# Patient Record
Sex: Female | Born: 1975 | Race: White | Hispanic: No | Marital: Married | Smoking: Former smoker
Health system: Southern US, Community
[De-identification: ages and names within clinical notes are randomized; demographics above are authoritative.]

## PROBLEM LIST (undated history)

## (undated) DIAGNOSIS — O1420 HELLP syndrome (HELLP), unspecified trimester: Secondary | ICD-10-CM

## (undated) DIAGNOSIS — F419 Anxiety disorder, unspecified: Secondary | ICD-10-CM

## (undated) DIAGNOSIS — Z98891 History of uterine scar from previous surgery: Secondary | ICD-10-CM

## (undated) DIAGNOSIS — D689 Coagulation defect, unspecified: Secondary | ICD-10-CM

## (undated) DIAGNOSIS — Z789 Other specified health status: Secondary | ICD-10-CM

## (undated) DIAGNOSIS — T7840XA Allergy, unspecified, initial encounter: Secondary | ICD-10-CM

## (undated) DIAGNOSIS — E785 Hyperlipidemia, unspecified: Secondary | ICD-10-CM

## (undated) DIAGNOSIS — J302 Other seasonal allergic rhinitis: Secondary | ICD-10-CM

## (undated) DIAGNOSIS — K219 Gastro-esophageal reflux disease without esophagitis: Secondary | ICD-10-CM

## (undated) HISTORY — PX: NO PAST SURGERIES: SHX2092

## (undated) HISTORY — DX: Allergy, unspecified, initial encounter: T78.40XA

## (undated) HISTORY — DX: Gastro-esophageal reflux disease without esophagitis: K21.9

## (undated) HISTORY — DX: Anxiety disorder, unspecified: F41.9

## (undated) HISTORY — DX: Hyperlipidemia, unspecified: E78.5

## (undated) HISTORY — DX: Coagulation defect, unspecified: D68.9

---

## 1997-11-15 ENCOUNTER — Emergency Department (HOSPITAL_COMMUNITY): Admission: EM | Admit: 1997-11-15 | Discharge: 1997-11-15 | Payer: Self-pay | Admitting: Emergency Medicine

## 1997-11-15 ENCOUNTER — Encounter: Payer: Self-pay | Admitting: Emergency Medicine

## 1999-09-18 ENCOUNTER — Other Ambulatory Visit: Admission: RE | Admit: 1999-09-18 | Discharge: 1999-09-18 | Payer: Self-pay | Admitting: Obstetrics and Gynecology

## 2000-09-17 ENCOUNTER — Other Ambulatory Visit: Admission: RE | Admit: 2000-09-17 | Discharge: 2000-09-17 | Payer: Self-pay | Admitting: *Deleted

## 2001-07-06 ENCOUNTER — Emergency Department (HOSPITAL_COMMUNITY): Admission: EM | Admit: 2001-07-06 | Discharge: 2001-07-06 | Payer: Self-pay | Admitting: Emergency Medicine

## 2001-07-06 ENCOUNTER — Encounter: Payer: Self-pay | Admitting: Emergency Medicine

## 2001-12-27 ENCOUNTER — Other Ambulatory Visit: Admission: RE | Admit: 2001-12-27 | Discharge: 2001-12-27 | Payer: Self-pay | Admitting: Obstetrics and Gynecology

## 2011-03-13 ENCOUNTER — Other Ambulatory Visit: Payer: Self-pay

## 2011-04-02 LAB — OB RESULTS CONSOLE RPR: RPR: NONREACTIVE

## 2011-04-02 LAB — OB RESULTS CONSOLE RUBELLA ANTIBODY, IGM: Rubella: IMMUNE

## 2011-04-02 LAB — OB RESULTS CONSOLE HIV ANTIBODY (ROUTINE TESTING): HIV: NONREACTIVE

## 2011-04-02 LAB — OB RESULTS CONSOLE HEPATITIS B SURFACE ANTIGEN: Hepatitis B Surface Ag: NEGATIVE

## 2011-04-21 ENCOUNTER — Ambulatory Visit (HOSPITAL_COMMUNITY): Payer: Self-pay

## 2011-04-24 ENCOUNTER — Ambulatory Visit (HOSPITAL_COMMUNITY)
Admission: RE | Admit: 2011-04-24 | Discharge: 2011-04-24 | Disposition: A | Discharge status: Home / Self Care | Payer: BC Managed Care – PPO | Source: Ambulatory Visit | Attending: Obstetrics and Gynecology | Admitting: Obstetrics and Gynecology

## 2011-04-24 ENCOUNTER — Other Ambulatory Visit: Payer: Self-pay

## 2011-04-24 ENCOUNTER — Encounter (HOSPITAL_COMMUNITY): Payer: Self-pay

## 2011-04-24 DIAGNOSIS — O3510X0 Maternal care for (suspected) chromosomal abnormality in fetus, unspecified, not applicable or unspecified: Secondary | ICD-10-CM | POA: Insufficient documentation

## 2011-04-24 DIAGNOSIS — IMO0002 Reserved for concepts with insufficient information to code with codable children: Secondary | ICD-10-CM | POA: Insufficient documentation

## 2011-04-24 DIAGNOSIS — O351XX0 Maternal care for (suspected) chromosomal abnormality in fetus, not applicable or unspecified: Secondary | ICD-10-CM | POA: Insufficient documentation

## 2011-04-24 DIAGNOSIS — O280 Abnormal hematological finding on antenatal screening of mother: Secondary | ICD-10-CM | POA: Insufficient documentation

## 2011-04-24 NOTE — Progress Notes (Signed)
Genetic Counseling  High-Risk Gestation Note  Appointment Date:  04/24/2011 Referred By: Rodell Perna, NP Date of Birth:  02/02/1976 Partner:  Crystal Mitchell     Pregnancy History: G1P0000 Estimated Date of Delivery: 10/27/11 Estimated Gestational Age: [redacted]w[redacted]d Attending: Particia Nearing, MD   [name] and her partner, [name], were seen for genetic counseling because of an increased risk for fetal Down syndrome based on First trimester screening performed through [facility].  They were counseled regarding the First trimester result and the associated 1 in 90 risk for fetal Down syndrome.  We reviewed chromosomes, nondisjunction, and the common features and variable prognosis of Down syndrome.  In addition, we reviewed the screen adjusted reduction in risk for trisomy 18 (1 in 556 to 1 in 580).  We also discussed other explanations for a screen positive result including: differences in maternal metabolism and normal variation.  We reviewed other available screening options including detailed ultrasound and cell free fetal DNA testing. We discussed that noninvasive prenatal testing (NIPT) utilizes cell free fetal DNA found in the maternal circulation. This test is not diagnostic for chromosome conditions, but can provide information regarding the presence or absence of extra fetal DNA for chromosomes 13, 18 and 21. Thus, it would not identify or rule out all fetal aneuploidy. The reported detection rate is greater than 99% for Trisomy 21, greater than 97% for Trisomy 18, and is approximately 80% (8 out of 10) for Trisomy 13. The false positive rate is reported to be less than 1% for any of these conditions. They were counseled that 50-80% of fetuses with Down syndrome/up to 90% of fetuses with trisomy 18, when well visualized, have detectable anomalies or soft markers by ultrasound. Additionally, we discussed the diagnostic options of CVS and amniocentesis. We reviewed the risks,  benefits, and limitations of each. They understand that these screens and tests cannot rule out all birth defects or genetic syndromes.  After consideration of all the options, and a clear understanding of the newness and limitations of NIPT, they elected to proceed with cell free fetal DNA testing ([facility]) at the time of today's visit, but declined CVS and amniocentesis. Those results will be available in 8-10 days and will be forwarded to her OB office when we receive them. [name] will consider the option of amniocentesis pending the results of this screening test. The patient stated that a detailed ultrasound is scheduled in her primary OB office. An ultrasound was not performed at the time of today's visit.    Both family histories were reviewed and found to be contributory for the father of the pregnancy's maternal first cousin once removed with Down syndrome (unspecified type). We discussed that 95% of cases of Down syndrome are not inherited and are the result of non-disjunction.  Three to 4% of cases of Down syndrome are the result of a translocation involving chromosome #21.  We discussed the option of chromosome analysis to determine if an individual is a carrier of a balanced translocation involving chromosome #21.  If an individual carries a balanced translocation involving chromosome #21, then the chance to have a baby with Down syndrome would be greater than the maternal age-related risk.  Given the reported family history, this relative's Down syndrome most likely was sporadic. However, additional information is needed regarding her karyotype or the chromosomes of the father of the pregnancy to assess whether or not this history increases recurrence risk for relatives.   Additionally, the  father of the pregnancy reported a nephew (his brother's son) with Asperger syndrome. We discussed that Asperger syndrome is an autism spectrum disorder (ASD). We discussed that ASDs are among  the most common neurodevelopmental disorders, with approximately 1 in 88 children meeting criteria for ASD.  Approximately 80% of individuals diagnosed are female.  There is strong evidence that genetic factors play a critical role in development of ASD.  There have been recent advances in identifying specific genetic causes of ASD, however, there are still many individuals for whom the etiology of the ASD is not known.  Once a family has a child with a diagnosis of ASD, there is a 13.5% chance to have another child with ASD.  If the pregnancy is female the chance is approximately 9%, and approximately 26% if the pregnancy is female.  There are limited data regarding recurrence risk for ASD in extended degree relatives in the absence of an identified genetic cause. We discussed that given the degree of relation and assuming multifactorial inheritance, recurrence risk would be expected to be similar to the general population risk. They understand that at this time there is not genetic testing available for ASD for most families.    Crystal Mitchell reported that her brother had medical issues throughout his lifetime and passed away at age 37 years, all related to an umbilical cord issue at birth. Additional detailed information was not provided. We reviewed that in the case of a problem with the umbilical cord at labor and delivery, recurrence risk is typically not expected to be increased for relatives. However, in the case of a different underlying cause, recurrence risk estimate may change. Without further information regarding the provided family history, an accurate genetic risk cannot be calculated. Further genetic counseling is warranted if more information is obtained.  Crystal Mitchell was provided with written information regarding cystic fibrosis (CF) including the carrier frequency and incidence in the Caucasian population, the availability of carrier testing and prenatal diagnosis if indicated.  In addition, we  discussed that CF is routinely screened for as part of the [place] newborn screening panel.  She declined testing today.   Additionally, Ms. Goris reported Ashkenazi Jewish ancestry. The father of the pregnancy reported no known Ashkenazi Jewish ancestry. We discussed the availability of carrier screening for certain genetic conditions based on Jewish ancestry.  Genetic testing is available for some of the more common conditions, including Canavan disease, cystic fibrosis, Tay sachs disease, Gaucher disease, Mucolipidosis type IV,  Neimann-Pick type A, Fanconi anemia type C, Bloom syndrome, and familial dysautonomia.  All of these conditions are inherited in an autosomal recessive fashion, where both parents have to be carriers of the condition before a baby is at increased risk (1 in 4, or 25% risk) for the disease.  We reviewed that given that the father of the pregnancy has no known Ashkenazi Jewish ancestry, he would have a low chance to be a carrier for these conditions, aside from cystic fibrosis as previously discussed. Carrier screening performed through a standard blood draw can reduce but not eliminate a person's chance to be a carrier for these conditions. Ms. Clegg declined carrier screening at the time of today's visit.   Ms. Briunna Leicht denied exposure to environmental toxins or chemical agents. She denied the use of tobacco or street drugs. She reported drinking alcohol before knowing she was pregnant, the last occurrence on 02/15/11. The all-or-none period was discussed, meaning exposures that occur in the first 4 weeks of  gestation are typically thought to either not affect the pregnancy at all or result in a miscarriage. She denied significant viral illnesses during the course of her pregnancy. Her medical and surgical histories were noncontributory.   I counseled this couple for approximately 40 minutes regarding the above risks and available options.     Quinn Plowman, MS,    Certified Genetic Counselor 04/24/2011

## 2011-05-05 ENCOUNTER — Telehealth (HOSPITAL_COMMUNITY): Payer: Self-pay | Admitting: MS"

## 2011-05-05 NOTE — Telephone Encounter (Signed)
Called Crystal Mitchell to discuss her [facility], cell free fetal DNA testing.  We reviewed that these are within normal limits, showing a less than 1 in 10,000 risk for trisomies 21, 18 and 13.  We reviewed that this testing identifies > 99% of pregnancies with trisomy 21, >97% of pregnancies with trisomy 2, and >80% with trisomy 18; the false positive rate is <0.1% for all conditions.  She understands that this testing does not identify all genetic conditions. [name] stated that she feels comfortable with these results and is not interested in pursuing amniocentesis.  All questions were answered to her satisfaction, she was encouraged to call with additional questions or concerns.  Quinn Plowman, MS Patent attorney

## 2011-05-05 NOTE — Telephone Encounter (Signed)
Left message for patient to return call.

## 2011-08-27 ENCOUNTER — Inpatient Hospital Stay (HOSPITAL_COMMUNITY)
Admission: AD | Admit: 2011-08-27 | Discharge: 2011-09-01 | DRG: 651 | Disposition: A | Discharge status: Home / Self Care | Payer: BC Managed Care – PPO | Source: Ambulatory Visit | Attending: Obstetrics and Gynecology | Admitting: Obstetrics and Gynecology

## 2011-08-27 ENCOUNTER — Inpatient Hospital Stay (HOSPITAL_COMMUNITY): Payer: BC Managed Care – PPO

## 2011-08-27 ENCOUNTER — Encounter (HOSPITAL_COMMUNITY): Payer: Self-pay | Admitting: *Deleted

## 2011-08-27 DIAGNOSIS — O1420 HELLP syndrome (HELLP), unspecified trimester: Secondary | ICD-10-CM | POA: Diagnosis present

## 2011-08-27 DIAGNOSIS — O1414 Severe pre-eclampsia complicating childbirth: Principal | ICD-10-CM | POA: Diagnosis present

## 2011-08-27 DIAGNOSIS — Z98891 History of uterine scar from previous surgery: Secondary | ICD-10-CM

## 2011-08-27 DIAGNOSIS — O4590 Premature separation of placenta, unspecified, unspecified trimester: Secondary | ICD-10-CM | POA: Diagnosis present

## 2011-08-27 HISTORY — DX: HELLP syndrome (HELLP), unspecified trimester: O14.20

## 2011-08-27 HISTORY — DX: History of uterine scar from previous surgery: Z98.891

## 2011-08-27 HISTORY — DX: Other specified health status: Z78.9

## 2011-08-27 HISTORY — DX: Other seasonal allergic rhinitis: J30.2

## 2011-08-27 LAB — URINE MICROSCOPIC-ADD ON

## 2011-08-27 LAB — COMPREHENSIVE METABOLIC PANEL
ALT: 114 U/L — ABNORMAL HIGH (ref 0–35)
Albumin: 2.4 g/dL — ABNORMAL LOW (ref 3.5–5.2)
BUN: 14 mg/dL (ref 6–23)
BUN: 13 mg/dL (ref 6–23)
CO2: 23 mEq/L (ref 19–32)
Calcium: 9.1 mg/dL (ref 8.4–10.5)
Calcium: 8.6 mg/dL (ref 8.4–10.5)
Creatinine, Ser: 0.73 mg/dL (ref 0.50–1.10)
GFR calc Af Amer: >90 mL/min (ref >90)
GFR calc Af Amer: >90 mL/min (ref >90)
GFR calc non Af Amer: >90 mL/min (ref >90)
Glucose, Bld: 72 mg/dL (ref 70–99)
Glucose, Bld: 95 mg/dL (ref 70–99)
Potassium: 4.2 mEq/L (ref 3.5–5.1)
Total Protein: 5.6 g/dL — ABNORMAL LOW (ref 6.0–8.3)
Total Protein: 5.6 g/dL — ABNORMAL LOW (ref 6.0–8.3)

## 2011-08-27 LAB — CBC
HCT: 35.1 % — ABNORMAL LOW (ref 36.0–46.0)
HCT: 36 % (ref 36.0–46.0)
Hemoglobin: 12.5 g/dL (ref 12.0–15.0)
Hemoglobin: 12.9 g/dL (ref 12.0–15.0)
MCH: 31.9 pg (ref 26.0–34.0)
MCH: 31.9 pg (ref 26.0–34.0)
MCHC: 35.6 g/dL (ref 30.0–36.0)
MCHC: 35.8 g/dL (ref 30.0–36.0)
MCV: 89.5 fL (ref 78.0–100.0)
RBC: 3.92 MIL/uL (ref 3.87–5.11)
RDW: 13.1 % (ref 11.5–15.5)

## 2011-08-27 LAB — URINALYSIS, ROUTINE W REFLEX MICROSCOPIC
Bilirubin Urine: NEGATIVE
Ketones, ur: NEGATIVE mg/dL
Leukocytes, UA: NEGATIVE
Nitrite: NEGATIVE
Specific Gravity, Urine: 1.01 (ref 1.005–1.030)
Urobilinogen, UA: 0.2 mg/dL (ref 0.0–1.0)
pH: 6.5 (ref 5.0–8.0)

## 2011-08-27 MED ORDER — PRENATAL MULTIVITAMIN CH
1.0000 | ORAL_TABLET | Freq: Every day | ORAL | Status: DC
  Administered 2011-08-29: 1 via ORAL
  Filled 2011-08-27 (×3): qty 1

## 2011-08-27 MED ORDER — MAGNESIUM SULFATE 40 MG/ML IJ SOLN
4.0000 g | Freq: Once | INTRAMUSCULAR | Status: DC
  Filled 2011-08-27: qty 100

## 2011-08-27 MED ORDER — ACETAMINOPHEN 325 MG PO TABS
650.0000 mg | ORAL_TABLET | ORAL | Status: DC | PRN
  Administered 2011-08-28: 650 mg via ORAL
  Filled 2011-08-27: qty 2

## 2011-08-27 MED ORDER — MAGNESIUM SULFATE BOLUS VIA INFUSION
4.0000 g | Freq: Once | INTRAVENOUS | Status: AC
  Administered 2011-08-27: 4 g via INTRAVENOUS
  Filled 2011-08-27: qty 500

## 2011-08-27 MED ORDER — DEXTROSE-NACL 5-0.45 % IV SOLN
INTRAVENOUS | Status: DC
  Administered 2011-08-27 – 2011-08-29 (×4): via INTRAVENOUS

## 2011-08-27 MED ORDER — MAGNESIUM SULFATE 40 G IN LACTATED RINGERS - SIMPLE
2.0000 g/h | Freq: Once | INTRAVENOUS | Status: AC
  Administered 2011-08-27: 2 g/h via INTRAVENOUS
  Filled 2011-08-27: qty 500

## 2011-08-27 MED ORDER — FAMOTIDINE 20 MG PO TABS
20.0000 mg | ORAL_TABLET | Freq: Once | ORAL | Status: AC
  Administered 2011-08-27: 20 mg via ORAL
  Filled 2011-08-27: qty 1

## 2011-08-27 MED ORDER — LACTATED RINGERS IV SOLN
INTRAVENOUS | Status: DC
  Administered 2011-08-27: 16:00:00 via INTRAVENOUS

## 2011-08-27 MED ORDER — DOCUSATE SODIUM 100 MG PO CAPS
100.0000 mg | ORAL_CAPSULE | Freq: Every day | ORAL | Status: DC
  Administered 2011-08-29: 100 mg via ORAL
  Filled 2011-08-27 (×3): qty 1

## 2011-08-27 MED ORDER — BETAMETHASONE SOD PHOS & ACET 6 (3-3) MG/ML IJ SUSP
12.0000 mg | INTRAMUSCULAR | Status: DC
  Administered 2011-08-27: 12 mg via INTRAMUSCULAR
  Filled 2011-08-27: qty 2

## 2011-08-27 MED ORDER — ZOLPIDEM TARTRATE 5 MG PO TABS: 5.0000 mg | ORAL_TABLET | Freq: Every evening | ORAL | Status: DC | PRN

## 2011-08-27 MED ORDER — CALCIUM CARBONATE ANTACID 500 MG PO CHEW
2.0000 | CHEWABLE_TABLET | ORAL | Status: DC | PRN
  Filled 2011-08-27: qty 2

## 2011-08-27 MED ORDER — MAGNESIUM SULFATE 40 G IN LACTATED RINGERS - SIMPLE
2.0000 g/h | Freq: Once | INTRAVENOUS | Status: DC
  Filled 2011-08-27: qty 500

## 2011-08-27 NOTE — MAU Note (Signed)
Pt states she had vomitting & epigastric, back & R shoulder pain on Monday, states she felt better on Tuesday.  Pt was sick again with abd pain on Wednesday, was seen in MD office today, sent for [facility] eval.  Pt has facial edema, also edema in legs & feet.

## 2011-08-27 NOTE — MAU Provider Note (Signed)
Chief Complaint:  Facial Swelling, Hypertension and Edema  First Provider Initiated Contact with Patient 08/27/11 1406     HPI  Crystal Mitchell is a 36 y.o. G1P0000 at [redacted]w[redacted]d she was sent to MAU from the office for Pre-eclampsia work-up. She reports facial swelling, heartburn vs epigastric pain, elevated BP in the office low urine output, concentrated urine. UA showed significant proteinuria w/ blood in the specimen per pt. Denies HA, scotoma, urinary Sx, contractions, leakage of fluid or vaginal bleeding. Good fetal movement.   Past Medical History: Past Medical History  Diagnosis Date  . Seasonal allergies     Past Surgical History: Past Surgical History  Procedure Date  . No past surgeries     Family History: Family History  Problem Relation Age of Onset  . Other Neg Hx     Social History: History  Substance Use Topics  . Smoking status: Never Smoker   . Smokeless tobacco: Not on file  . Alcohol Use: No    Allergies:  Allergies  Allergen Reactions  . Amoxicillin Nausea And Vomiting    Meds:  Prescriptions prior to admission  Medication Sig Dispense Refill  . acetaminophen (TYLENOL) 325 MG tablet Take 325 mg by mouth every 6 (six) hours as needed.      . calcium carbonate (TUMS - DOSED IN MG ELEMENTAL CALCIUM) 500 MG chewable tablet Chew 1 tablet by mouth daily.      . Docosahexaenoic Acid (DHA OMEGA 3 PO) Take by mouth.      . Prenatal Vit-Fe Fumarate-FA (PRENATAL MULTIVITAMIN) TABS Take 1 tablet by mouth daily.          Physical Exam  Blood pressure 162/107, pulse 77, temperature 98.1 F (36.7 C), temperature source Oral, resp. rate 20, last menstrual period 01/20/2011. GENERAL: Well-developed, well-nourished female in no acute distress.  HEENT: normocephalic, good dentition HEART: normal rate RESP: normal effort ABDOMEN: Soft, nontender, gravid appropriate for gestational age EXTREMITIES: Nontender, no edema NEURO: alert and oriented  SPECULUM  EXAM: Deferred  FHT:  Baseline 140 , moderate variability, accelerations present, no decelerations Contractions: irreg, painless   Labs: Results for orders placed during the hospital encounter of 08/27/11 (from the past 24 hour(s))  CBC     Status: Abnormal   Collection Time   08/27/11  2:00 PM      Component Value Range   WBC 10.5  4.0 - 10.5 K/uL   RBC 3.92  3.87 - 5.11 MIL/uL   Hemoglobin 12.5  12.0 - 15.0 g/dL   HCT 14.7 (*) 82.9 - 56.2 %   MCV 89.5  78.0 - 100.0 fL   MCH 31.9  26.0 - 34.0 pg   MCHC 35.6  30.0 - 36.0 g/dL   RDW 13.0  86.5 - 78.4 %   Platelets 104 (*) 150 - 400 K/uL  COMPREHENSIVE METABOLIC PANEL     Status: Abnormal   Collection Time   08/27/11  2:00 PM      Component Value Range   Sodium 132 (*) 135 - 145 mEq/L   Potassium 4.2  3.5 - 5.1 mEq/L   Chloride 99  96 - 112 mEq/L   CO2 23  19 - 32 mEq/L   Glucose, Bld 72  70 - 99 mg/dL   BUN 14  6 - 23 mg/dL   Creatinine, Ser 6.96  0.50 - 1.10 mg/dL   Calcium 9.1  8.4 - 29.5 mg/dL   Total Protein 5.6 (*) 6.0 - 8.3 g/dL  Albumin 2.5 (*) 3.5 - 5.2 g/dL   AST 440 (*) 0 - 37 U/L   ALT 114 (*) 0 - 35 U/L   Alkaline Phosphatase 154 (*) 39 - 117 U/L   Total Bilirubin 1.0  0.3 - 1.2 mg/dL   GFR calc non Af Amer >90  >90 mL/min   GFR calc Af Amer >90  >90 mL/min  URINALYSIS, ROUTINE W REFLEX MICROSCOPIC     Status: Abnormal   Collection Time   08/27/11  3:00 PM      Component Value Range   Color, Urine YELLOW  YELLOW   APPearance CLEAR  CLEAR   Specific Gravity, Urine 1.010  1.005 - 1.030   pH 6.5  5.0 - 8.0   Glucose, UA NEGATIVE  NEGATIVE mg/dL   Hgb urine dipstick LARGE (*) NEGATIVE   Bilirubin Urine NEGATIVE  NEGATIVE   Ketones, ur NEGATIVE  NEGATIVE mg/dL   Protein, ur >347 (*) NEGATIVE mg/dL   Urobilinogen, UA 0.2  0.0 - 1.0 mg/dL   Nitrite NEGATIVE  NEGATIVE   Leukocytes, UA NEGATIVE  NEGATIVE  URINE MICROSCOPIC-ADD ON     Status: Abnormal   Collection Time   08/27/11  3:00 PM      Component Value  Range   Squamous Epithelial / LPF FEW (*) RARE   WBC, UA 0-2  <3 WBC/hpf   RBC / HPF 0-2  <3 RBC/hpf   Imaging:  NA  Assessment: HELLP syndrome  Plan: Admit to Antenatal per consult w/ Dr. Senaida Ores for BMZ, Magnesium sulfate, NICU consult, repeat labs.  [place], [place] 7/18/20133:25 PM

## 2011-08-27 NOTE — MAU Note (Signed)
Increased swelling in face and ankles since Fri.  BP up in office today.  Reports increase heartburn and pain in upper back.  Denies headache or epigastric pain.

## 2011-08-27 NOTE — Anesthesia Preprocedure Evaluation (Addendum)
Anesthesia Evaluation  Patient identified by MRN, date of birth, ID band Patient awake    Reviewed: Allergy & Precautions, H&P , NPO status , Patient's Chart, lab work & pertinent test results, reviewed documented beta blocker date and time   History of Anesthesia Complications Negative for: history of anesthetic complications  Airway Mallampati: III TM Distance: <3 FB Neck ROM: full    Dental  (+) Teeth Intact   Pulmonary neg pulmonary ROS,  breath sounds clear to auscultation        Cardiovascular hypertension (severe preeclampsia/HELLP syndrome), Rhythm:regular Rate:Normal     Neuro/Psych negative neurological ROS  negative psych ROS   GI/Hepatic GERD-  ,Elevated liver enzymes and right upper quadrant pain   Endo/Other  negative endocrine ROS  Renal/GU negative Renal ROS  negative genitourinary   Musculoskeletal   Abdominal   Peds  Hematology Thrombocytopenia - plts 104   Anesthesia Other Findings Breath sounds clear - but complains of right upper quadrant pain with deep inspiration for lung auscultation Significant edema (has gained 6 pounds this week)  Repeat Plts @ 0751 7/19...Marland KitchenMarland KitchenMarland Kitchen109K  Reproductive/Obstetrics (+) Pregnancy                         Anesthesia Physical Anesthesia Plan  ASA: III  Anesthesia Plan: Spinal and General ETT   Post-op Pain Management:    Induction:   Airway Management Planned:   Additional Equipment:   Intra-op Plan:   Post-operative Plan:   Informed Consent: I have reviewed the patients History and Physical, chart, labs and discussed the procedure including the risks, benefits and alternatives for the proposed anesthesia with the patient or authorized representative who has indicated his/her understanding and acceptance.     Plan Discussed with: CRNA and Surgeon  Anesthesia Plan Comments: (Talked at length with patient about potential anesthetic  plans and risks.  Discussed GA vs SAB for c/s depending on platelet count.  Per Dr Senaida Ores, pt was just admitted and started on Magnesium.  Will recheck labs at 8 pm, may need delivery if LFTs or platelets worsening.  Jasmine December, MD)        Anesthesia Quick Evaluation

## 2011-08-27 NOTE — Consult Note (Signed)
[facility]  Neonatal Medicine Consultation       08/27/2011    10:08 PM  I was called at the request of the patient's obstetrician (Dr. Senaida Ores) to speak to this patient due to likely preterm delivery due to severe preeclampsia.  The patient is currently at 31 2/7 weeks.  She is receiving treatment with magnesium sulfate, betamethasone.    I reviewed expectations for a premature delivery at 31 weeks and beyond.  I covered typical issues such as respiratory distress, infection, feeding, retinopathy, bleeding, length of stay.  Mom plans to breast feed, which I encouraged.    _____________________ Electronically Signed By: Angelita Ingles, MD Neonatologist

## 2011-08-27 NOTE — H&P (Signed)
Crystal Mitchell is a 36 y.o. female G1P0 at 56 2/7 weeks (EDD 10/27/11 by LMP c/w 9 week [place]) presenting for preeclampsia/HELLP sx.  Pt seen in office with c/o epigastric pain and found to have 3+proteinuria and elevated blood pressures to 140-160/90-100.  Sent to MAU for labwork and NST . Labs showed abnormality of LFT's at 137/114 and platelets at 104,000.  Pt given pepcid with some improvement in epigastric pain, no HA currently--but has had some this week.  Prenatal care significant only for an elevated DSR of 1:90.  Pt had MFM consult with normal [place] and [facility] test.  Baseline BP this pregnancy are 120/60-70.  History OB History    Grav Para Term Preterm Abortions TAB SAB Ect Mult Living   1 0 0 0 0 0 0 0 0 0      Past Medical History  Diagnosis Date  . Seasonal allergies   . No pertinent past medical history    Past Surgical History  Procedure Date  . No past surgeries    Family History: family history includes Heart disease in her maternal grandfather, paternal grandfather, and paternal grandmother and Hypertension in her maternal grandmother and mother.  There is no history of Other. Social History:  reports that she has never smoked. She does not have any smokeless tobacco history on file. She reports that she does not drink alcohol or use illicit drugs.   Prenatal Transfer Tool  Maternal Diabetes: No  One hour GTT elevated at 154                                          Three hour GTT WNL Genetic Screening: Abnormal:  Results: Elevated risk of Trisomy 21 on first trimester screen.  [facility] test negative and MFM US WNL. Maternal Ultrasounds/Referrals: Normal Fetal Ultrasounds or other Referrals:  Referred to Materal Fetal Medicine  Maternal Substance Abuse:  No Significant Maternal Medications:  Meds include: Other: see prenatal record  Betamethasone given 08/27/11 at 1637pm Significant Maternal Lab Results: Maternal HELLP syndrome Other Comments:  Mother on magnesium for  preeclampsia intrapartum  Review of Systems  Gastrointestinal: Positive for abdominal pain.  Neurological: Positive for headaches.      Blood pressure 172/102, pulse 76, temperature 98.1 F (36.7 C), temperature source Oral, resp. rate 22, height 5\' 4"  (1.626 m), weight 77.111 kg (170 lb), last menstrual period 01/20/2011. Maternal Exam:  Uterine Assessment: Contraction frequency is rare.   Abdomen: Patient reports no abdominal tenderness. Fetal presentation: vertex  Introitus: Normal vulva. Normal vagina.    Physical Exam  Constitutional: She is oriented to person, place, and time. She appears well-developed and well-nourished.  Cardiovascular: Normal rate and regular rhythm.   Respiratory: Effort normal and breath sounds normal.  GI: Soft. Bowel sounds are normal.  Genitourinary: Vagina normal and uterus normal.  Neurological: She is alert and oriented to person, place, and time.  Psychiatric: She has a normal mood and affect. Her behavior is normal.    Prenatal labs: ABO, Rh: A/Positive/-- (02/21 0000) Antibody:  negative Rubella: Immune (02/21 0000) RPR: Nonreactive (02/21 0000)  HBsAg: Negative (02/21 0000)  HIV: Non-reactive (02/21 0000)  GBS:   unknown One hour GTT 154 Three hour GTT 69, 166, 129, 91  Assessment/Plan: Pt admitted for close surveillance of preeclampsia/HELLP syndrome.  Given first dose of betamethasone at 1637pm.  Continuous monitoring and NPO for  now.  Pt started on magnesium for seizure prophylaxis.  Will repeat labs at 2000pm and if stable allow pt to eat.   Formal US report pending, but baby very active vertex and normal growth.  EFM reactive.  D/w pt possibility of c-section to deliver if platelets drop precipitously or labs change rapidly.  Dr. Rodman Pickle and NICU aware of pt and consulted to see her.  Oliver Pila 08/27/2011, 5:58 PM

## 2011-08-27 NOTE — Progress Notes (Signed)
Patient ID: Crystal Mitchell, female   DOB: 1975/11/21, 36 y.o.   MRN: [id number] Pt with repeat labs showing that LFT's are the same and platelets slightly lower at 89,000.  BP about the same 160/90-100 for the most part.   Discussed pt with Rica Koyanagi of MFM and she recommends repeat labs in a few hours and if platelets drop further delivery.  If platelets are about the same can try to press on to get steroids on board. Pt and husband updated.

## 2011-08-28 ENCOUNTER — Encounter (HOSPITAL_COMMUNITY): Payer: Self-pay | Admitting: *Deleted

## 2011-08-28 LAB — CBC
HCT: 35.8 % — ABNORMAL LOW (ref 36.0–46.0)
HCT: 32.9 % — ABNORMAL LOW (ref 36.0–46.0)
Hemoglobin: 12.7 g/dL (ref 12.0–15.0)
Hemoglobin: 12.5 g/dL (ref 12.0–15.0)
Hemoglobin: 11.7 g/dL — ABNORMAL LOW (ref 12.0–15.0)
MCH: 31.9 pg (ref 26.0–34.0)
MCH: 32.4 pg (ref 26.0–34.0)
MCHC: 35.8 g/dL (ref 30.0–36.0)
MCHC: 35.6 g/dL (ref 30.0–36.0)
MCV: 89.2 fL (ref 78.0–100.0)
MCV: 90.2 fL (ref 78.0–100.0)
Platelets: 94 10*3/uL — ABNORMAL LOW (ref 150–400)
RBC: 3.98 MIL/uL (ref 3.87–5.11)
RBC: 3.99 MIL/uL (ref 3.87–5.11)
RBC: 3.86 MIL/uL — ABNORMAL LOW (ref 3.87–5.11)

## 2011-08-28 LAB — COMPREHENSIVE METABOLIC PANEL
ALT: 95 U/L — ABNORMAL HIGH (ref 0–35)
AST: 107 U/L — ABNORMAL HIGH (ref 0–37)
AST: 76 U/L — ABNORMAL HIGH (ref 0–37)
Albumin: 2.5 g/dL — ABNORMAL LOW (ref 3.5–5.2)
Alkaline Phosphatase: 149 U/L — ABNORMAL HIGH (ref 39–117)
BUN: 15 mg/dL (ref 6–23)
CO2: 21 mEq/L (ref 19–32)
CO2: 20 mEq/L (ref 19–32)
CO2: 20 mEq/L (ref 19–32)
Calcium: 8.2 mg/dL — ABNORMAL LOW (ref 8.4–10.5)
Calcium: 7.8 mg/dL — ABNORMAL LOW (ref 8.4–10.5)
Calcium: 7.3 mg/dL — ABNORMAL LOW (ref 8.4–10.5)
Creatinine, Ser: 0.74 mg/dL (ref 0.50–1.10)
Creatinine, Ser: 0.79 mg/dL (ref 0.50–1.10)
Creatinine, Ser: 0.76 mg/dL (ref 0.50–1.10)
GFR calc Af Amer: >90 mL/min (ref >90)
GFR calc Af Amer: >90 mL/min (ref >90)
GFR calc Af Amer: >90 mL/min (ref >90)
GFR calc non Af Amer: >90 mL/min (ref >90)
GFR calc non Af Amer: >90 mL/min (ref >90)
GFR calc non Af Amer: >90 mL/min (ref >90)
GFR calc non Af Amer: >90 mL/min (ref >90)
Glucose, Bld: 130 mg/dL — ABNORMAL HIGH (ref 70–99)
Glucose, Bld: 163 mg/dL — ABNORMAL HIGH (ref 70–99)
Potassium: 4.5 mEq/L (ref 3.5–5.1)
Sodium: 132 mEq/L — ABNORMAL LOW (ref 135–145)
Total Bilirubin: 0.6 mg/dL (ref 0.3–1.2)
Total Protein: 5.5 g/dL — ABNORMAL LOW (ref 6.0–8.3)
Total Protein: 5.4 g/dL — ABNORMAL LOW (ref 6.0–8.3)

## 2011-08-28 LAB — MAGNESIUM: Magnesium: 6.1 mg/dL (ref 1.5–2.5)

## 2011-08-28 MED ORDER — MAGNESIUM SULFATE 40 G IN LACTATED RINGERS - SIMPLE: 1.0000 g/h | INTRAVENOUS | Status: DC

## 2011-08-28 MED ORDER — BETAMETHASONE SOD PHOS & ACET 6 (3-3) MG/ML IJ SUSP
12.0000 mg | Freq: Once | INTRAMUSCULAR | Status: AC
  Administered 2011-08-28: 12 mg via INTRAMUSCULAR
  Filled 2011-08-28: qty 2

## 2011-08-28 MED ORDER — FAMOTIDINE 20 MG PO TABS
20.0000 mg | ORAL_TABLET | Freq: Two times a day (BID) | ORAL | Status: DC
  Administered 2011-08-28 – 2011-09-01 (×6): 20 mg via ORAL
  Filled 2011-08-28 (×8): qty 1

## 2011-08-28 MED ORDER — SODIUM CHLORIDE 0.9 % IJ SOLN: 10.0000 mL | Freq: Two times a day (BID) | INTRAMUSCULAR | Status: DC

## 2011-08-28 MED ORDER — SODIUM CHLORIDE 0.9 % IJ SOLN
3.0000 mL | Freq: Two times a day (BID) | INTRAMUSCULAR | Status: DC
  Administered 2011-08-28 – 2011-08-31 (×5): 3 mL via INTRAVENOUS

## 2011-08-28 NOTE — Progress Notes (Signed)
Patient ID: Crystal Mitchell, female   DOB: Jun 23, 1975, 36 y.o.   MRN: [id number]  Pt states no HA, +FM, no LOF, no VB, no ctx. D/w anesthesia, likely to place dry cath AF 150/90, Plt at 8pm 109 gen NAD Abd soft, FNT  FHTs reassuring toco none  D/w pt POC Meals of clears Likely LTCS when plts fall or LFTs bump Monitor labs q 6 hr

## 2011-08-28 NOTE — Progress Notes (Signed)
Patient ID: Crystal Mitchell, female   DOB: [birth year], 36 y.o.   MRN: [id number] Pt resting comfortably. Repeat labs stable with platelets up to 94,000 and LFT's slightly improved as well.  BP stable on Magnesium 150-160/90-100 Will repeat betamethasone at 430am and plan repeat labs for 0800.  Pt still NPO as will possibly require general anesthesia at delivery.

## 2011-08-28 NOTE — Progress Notes (Signed)
Spoke to md - pt may eat - will repeat labs now every 6 hrs, next at 8pm

## 2011-08-28 NOTE — Progress Notes (Signed)
Patient ID: Crystal Mitchell, female   DOB: 1975-10-06, 36 y.o.   MRN: [id number] 31+ wk with HELLP Mildly elevated BP, plts stable, LFTs stable Plan for delivery tomorrow 48hr after steroids, 4:30pm Magnesium decreased secondary to symptoms, level was 6.1

## 2011-08-28 NOTE — Anesthesia Procedure Notes (Signed)
Epidural Patient location during procedure: OB Start time: 08/28/2011 12:55 PM End time: 08/28/2011 1:12 PM  Staffing Anesthesiologist: Jiles Garter  Preanesthetic Checklist Completed: patient identified, site marked, surgical consent, pre-op evaluation, timeout performed, IV checked, risks and benefits discussed and monitors and equipment checked  Epidural Patient position: sitting Prep: site prepped and draped and DuraPrep Patient monitoring: continuous pulse ox and blood pressure Approach: midline Injection technique: LOR air  Needle:  Needle type: Tuohy  Needle gauge: 17 G Needle length: 9 cm Needle insertion depth: 6 cm Catheter type: closed end flexible Catheter size: 19 Gauge Catheter at skin depth: 11 cm Test dose: negative  Assessment Events: blood not aspirated, injection not painful, no injection resistance, negative IV test and no paresthesia  Additional Notes Vague L sided mid back parasthesia with catheter insertion. Transient and not distinct location

## 2011-08-28 NOTE — Progress Notes (Signed)
Dry epidural cath placed by Dr. Jean Rosenthal. Pt tolerated procedure well.

## 2011-08-28 NOTE — Progress Notes (Signed)
UR Chart review completed.  

## 2011-08-29 ENCOUNTER — Encounter (HOSPITAL_COMMUNITY): Payer: Self-pay | Admitting: Anesthesiology

## 2011-08-29 ENCOUNTER — Encounter (HOSPITAL_COMMUNITY): Payer: Self-pay | Admitting: *Deleted

## 2011-08-29 ENCOUNTER — Encounter (HOSPITAL_COMMUNITY): Payer: Self-pay

## 2011-08-29 ENCOUNTER — Encounter (HOSPITAL_COMMUNITY)
Admission: AD | Disposition: A | Discharge status: Home / Self Care | Payer: Self-pay | Source: Ambulatory Visit | Attending: Obstetrics and Gynecology

## 2011-08-29 ENCOUNTER — Inpatient Hospital Stay (HOSPITAL_COMMUNITY): Payer: BC Managed Care – PPO | Admitting: Anesthesiology

## 2011-08-29 LAB — CBC
HCT: 34.4 % — ABNORMAL LOW (ref 36.0–46.0)
HCT: 30.3 % — ABNORMAL LOW (ref 36.0–46.0)
Hemoglobin: 12.4 g/dL (ref 12.0–15.0)
Hemoglobin: 12 g/dL (ref 12.0–15.0)
MCH: 31.8 pg (ref 26.0–34.0)
MCH: 32 pg (ref 26.0–34.0)
MCHC: 35.6 g/dL (ref 30.0–36.0)
MCHC: 34.9 g/dL (ref 30.0–36.0)
MCHC: 35.3 g/dL (ref 30.0–36.0)
MCV: 90.3 fL (ref 78.0–100.0)
MCV: 91 fL (ref 78.0–100.0)
MCV: 90.7 fL (ref 78.0–100.0)
Platelets: 149 10*3/uL — ABNORMAL LOW (ref 150–400)
Platelets: 148 10*3/uL — ABNORMAL LOW (ref 150–400)
RBC: 3.81 MIL/uL — ABNORMAL LOW (ref 3.87–5.11)
RDW: 13.3 % (ref 11.5–15.5)
RDW: 13.4 % (ref 11.5–15.5)
RDW: 13.4 % (ref 11.5–15.5)

## 2011-08-29 LAB — TYPE AND SCREEN
ABO/RH(D): A POS
Antibody Screen: NEGATIVE

## 2011-08-29 LAB — COMPREHENSIVE METABOLIC PANEL
ALT: 94 U/L — ABNORMAL HIGH (ref 0–35)
AST: 63 U/L — ABNORMAL HIGH (ref 0–37)
AST: 91 U/L — ABNORMAL HIGH (ref 0–37)
Albumin: 2.5 g/dL — ABNORMAL LOW (ref 3.5–5.2)
Albumin: 2.6 g/dL — ABNORMAL LOW (ref 3.5–5.2)
Albumin: 2.6 g/dL — ABNORMAL LOW (ref 3.5–5.2)
Alkaline Phosphatase: 142 U/L — ABNORMAL HIGH (ref 39–117)
Alkaline Phosphatase: 149 U/L — ABNORMAL HIGH (ref 39–117)
BUN: 16 mg/dL (ref 6–23)
Chloride: 97 mEq/L (ref 96–112)
Creatinine, Ser: 0.78 mg/dL (ref 0.50–1.10)
GFR calc Af Amer: >90 mL/min (ref >90)
Glucose, Bld: 114 mg/dL — ABNORMAL HIGH (ref 70–99)
Potassium: 4.1 mEq/L (ref 3.5–5.1)
Potassium: 4.1 mEq/L (ref 3.5–5.1)
Sodium: 129 mEq/L — ABNORMAL LOW (ref 135–145)
Sodium: 134 mEq/L — ABNORMAL LOW (ref 135–145)
Total Bilirubin: 0.3 mg/dL (ref 0.3–1.2)
Total Bilirubin: 0.8 mg/dL (ref 0.3–1.2)
Total Protein: 5.8 g/dL — ABNORMAL LOW (ref 6.0–8.3)
Total Protein: 5.4 g/dL — ABNORMAL LOW (ref 6.0–8.3)

## 2011-08-29 LAB — SURGICAL PCR SCREEN: MRSA, PCR: NEGATIVE

## 2011-08-29 LAB — URINE CULTURE: Special Requests: NORMAL

## 2011-08-29 LAB — RPR: RPR Ser Ql: NONREACTIVE

## 2011-08-29 SURGERY — Surgical Case
Anesthesia: Regional | Site: Abdomen | Wound class: Clean Contaminated

## 2011-08-29 MED ORDER — WITCH HAZEL-GLYCERIN EX PADS: 1.0000 "application " | MEDICATED_PAD | CUTANEOUS | Status: DC | PRN

## 2011-08-29 MED ORDER — NALOXONE HCL 0.4 MG/ML IJ SOLN: 0.4000 mg | INTRAMUSCULAR | Status: DC | PRN

## 2011-08-29 MED ORDER — DIPHENHYDRAMINE HCL 25 MG PO CAPS: 25.0000 mg | ORAL_CAPSULE | ORAL | Status: DC | PRN

## 2011-08-29 MED ORDER — ZOLPIDEM TARTRATE 5 MG PO TABS: 5.0000 mg | ORAL_TABLET | Freq: Every evening | ORAL | Status: DC | PRN

## 2011-08-29 MED ORDER — ONDANSETRON HCL 4 MG/2ML IJ SOLN: 4.0000 mg | INTRAMUSCULAR | Status: DC | PRN

## 2011-08-29 MED ORDER — MENTHOL 3 MG MT LOZG: 1.0000 | LOZENGE | OROMUCOSAL | Status: DC | PRN

## 2011-08-29 MED ORDER — ONDANSETRON HCL 4 MG/2ML IJ SOLN
INTRAMUSCULAR | Status: AC
  Filled 2011-08-29: qty 2

## 2011-08-29 MED ORDER — OXYCODONE-ACETAMINOPHEN 5-325 MG PO TABS
1.0000 | ORAL_TABLET | ORAL | Status: DC | PRN
  Administered 2011-08-30 – 2011-08-31 (×2): 1 via ORAL
  Administered 2011-08-31: 2 via ORAL
  Administered 2011-08-31: 1 via ORAL
  Administered 2011-08-31: 2 via ORAL
  Administered 2011-09-01: 1 via ORAL
  Filled 2011-08-29: qty 1
  Filled 2011-08-29: qty 2
  Filled 2011-08-29 (×3): qty 1
  Filled 2011-08-29: qty 2

## 2011-08-29 MED ORDER — ONDANSETRON HCL 4 MG/2ML IJ SOLN
INTRAMUSCULAR | Status: DC | PRN
  Administered 2011-08-29: 4 mg via INTRAVENOUS

## 2011-08-29 MED ORDER — LACTATED RINGERS IV SOLN
INTRAVENOUS | Status: DC | PRN
  Administered 2011-08-29: 17:00:00 via INTRAVENOUS

## 2011-08-29 MED ORDER — SIMETHICONE 80 MG PO CHEW
80.0000 mg | CHEWABLE_TABLET | Freq: Three times a day (TID) | ORAL | Status: DC
  Administered 2011-08-30 – 2011-09-01 (×5): 80 mg via ORAL

## 2011-08-29 MED ORDER — SODIUM CHLORIDE 0.9 % IV SOLN
1.0000 ug/kg/h | INTRAVENOUS | Status: DC | PRN
  Filled 2011-08-29: qty 2.5

## 2011-08-29 MED ORDER — GENTAMICIN SULFATE 40 MG/ML IJ SOLN: INTRAVENOUS | Status: DC

## 2011-08-29 MED ORDER — MORPHINE SULFATE 0.5 MG/ML IJ SOLN
INTRAMUSCULAR | Status: AC
  Filled 2011-08-29: qty 10

## 2011-08-29 MED ORDER — EPHEDRINE SULFATE 50 MG/ML IJ SOLN
INTRAMUSCULAR | Status: DC | PRN
  Administered 2011-08-29 (×2): 10 mg via INTRAVENOUS

## 2011-08-29 MED ORDER — MORPHINE SULFATE (PF) 0.5 MG/ML IJ SOLN
INTRAMUSCULAR | Status: DC | PRN
  Administered 2011-08-29: 1 mg via INTRAVENOUS

## 2011-08-29 MED ORDER — SODIUM CHLORIDE 0.9 % IJ SOLN: 3.0000 mL | INTRAMUSCULAR | Status: DC | PRN

## 2011-08-29 MED ORDER — SODIUM CHLORIDE 0.9 % IR SOLN
Status: DC | PRN
  Administered 2011-08-29: 1000 mL

## 2011-08-29 MED ORDER — EPHEDRINE 5 MG/ML INJ
INTRAVENOUS | Status: AC
  Filled 2011-08-29: qty 10

## 2011-08-29 MED ORDER — MEPERIDINE HCL 25 MG/ML IJ SOLN: 6.2500 mg | INTRAMUSCULAR | Status: DC | PRN

## 2011-08-29 MED ORDER — SENNOSIDES-DOCUSATE SODIUM 8.6-50 MG PO TABS
2.0000 | ORAL_TABLET | Freq: Every day | ORAL | Status: DC
  Administered 2011-08-31: 2 via ORAL

## 2011-08-29 MED ORDER — OXYTOCIN 40 UNITS IN LACTATED RINGERS INFUSION - SIMPLE MED
62.5000 mL/h | INTRAVENOUS | Status: AC
  Administered 2011-08-30: 62.5 mL/h via INTRAVENOUS
  Filled 2011-08-29: qty 1000

## 2011-08-29 MED ORDER — CITRIC ACID-SODIUM CITRATE 334-500 MG/5ML PO SOLN
ORAL | Status: AC
  Administered 2011-08-29: 30 mL
  Filled 2011-08-29: qty 15

## 2011-08-29 MED ORDER — METOCLOPRAMIDE HCL 5 MG/ML IJ SOLN: 10.0000 mg | Freq: Three times a day (TID) | INTRAMUSCULAR | Status: DC | PRN

## 2011-08-29 MED ORDER — TETANUS-DIPHTH-ACELL PERTUSSIS 5-2.5-18.5 LF-MCG/0.5 IM SUSP
0.5000 mL | Freq: Once | INTRAMUSCULAR | Status: DC
  Filled 2011-08-29: qty 0.5

## 2011-08-29 MED ORDER — LORAZEPAM 1 MG PO TABS
0.5000 mg | ORAL_TABLET | Freq: Once | ORAL | Status: AC
  Administered 2011-08-29: 0.5 mg via ORAL
  Filled 2011-08-29: qty 1

## 2011-08-29 MED ORDER — ONDANSETRON HCL 4 MG/2ML IJ SOLN: 4.0000 mg | Freq: Three times a day (TID) | INTRAMUSCULAR | Status: DC | PRN

## 2011-08-29 MED ORDER — CLINDAMYCIN PHOSPHATE 600 MG/50ML IV SOLN
INTRAVENOUS | Status: DC | PRN
  Administered 2011-08-29: 900 mg via INTRAVENOUS

## 2011-08-29 MED ORDER — PHENYLEPHRINE HCL 10 MG/ML IJ SOLN
INTRAMUSCULAR | Status: DC | PRN
  Administered 2011-08-29 (×2): 80 ug via INTRAVENOUS

## 2011-08-29 MED ORDER — DIPHENHYDRAMINE HCL 50 MG/ML IJ SOLN: 25.0000 mg | INTRAMUSCULAR | Status: DC | PRN

## 2011-08-29 MED ORDER — GENTAMICIN SULFATE 40 MG/ML IJ SOLN
INTRAVENOUS | Status: DC
  Filled 2011-08-29: qty 9.63

## 2011-08-29 MED ORDER — SCOPOLAMINE 1 MG/3DAYS TD PT72
1.0000 | MEDICATED_PATCH | Freq: Once | TRANSDERMAL | Status: AC
  Administered 2011-08-29: 1.5 mg via TRANSDERMAL

## 2011-08-29 MED ORDER — FENTANYL CITRATE 0.05 MG/ML IJ SOLN
INTRAMUSCULAR | Status: AC
  Filled 2011-08-29: qty 2

## 2011-08-29 MED ORDER — SODIUM BICARBONATE 8.4 % IV SOLN
INTRAVENOUS | Status: DC | PRN
  Administered 2011-08-29: 2 mL via EPIDURAL

## 2011-08-29 MED ORDER — NALBUPHINE HCL 10 MG/ML IJ SOLN
5.0000 mg | INTRAMUSCULAR | Status: DC | PRN
  Filled 2011-08-29: qty 1

## 2011-08-29 MED ORDER — CALCIUM CARBONATE ANTACID 500 MG PO CHEW
1.0000 | CHEWABLE_TABLET | Freq: Every day | ORAL | Status: DC
  Filled 2011-08-29: qty 1

## 2011-08-29 MED ORDER — DIBUCAINE 1 % RE OINT: 1.0000 "application " | TOPICAL_OINTMENT | RECTAL | Status: DC | PRN

## 2011-08-29 MED ORDER — SCOPOLAMINE 1 MG/3DAYS TD PT72
MEDICATED_PATCH | TRANSDERMAL | Status: AC
  Administered 2011-08-29: 1.5 mg via TRANSDERMAL
  Filled 2011-08-29: qty 1

## 2011-08-29 MED ORDER — PRENATAL MULTIVITAMIN CH
1.0000 | ORAL_TABLET | Freq: Every day | ORAL | Status: DC
  Administered 2011-08-30 – 2011-09-01 (×3): 1 via ORAL
  Filled 2011-08-29 (×3): qty 1

## 2011-08-29 MED ORDER — ONDANSETRON HCL 4 MG PO TABS: 4.0000 mg | ORAL_TABLET | ORAL | Status: DC | PRN

## 2011-08-29 MED ORDER — CLINDAMYCIN PHOSPHATE 600 MG/50ML IV SOLN: INTRAVENOUS | Status: DC | PRN

## 2011-08-29 MED ORDER — MAGNESIUM SULFATE 40 G IN LACTATED RINGERS - SIMPLE
1.0000 g/h | INTRAVENOUS | Status: DC
  Administered 2011-08-29: 1.5 g/h via INTRAVENOUS
  Filled 2011-08-29: qty 500

## 2011-08-29 MED ORDER — FENTANYL CITRATE 0.05 MG/ML IJ SOLN: 25.0000 ug | INTRAMUSCULAR | Status: DC | PRN

## 2011-08-29 MED ORDER — IBUPROFEN 800 MG PO TABS
800.0000 mg | ORAL_TABLET | Freq: Three times a day (TID) | ORAL | Status: DC
  Administered 2011-08-30 – 2011-09-01 (×2): 800 mg via ORAL
  Filled 2011-08-29 (×2): qty 1

## 2011-08-29 MED ORDER — MIDAZOLAM HCL 2 MG/2ML IJ SOLN: 0.5000 mg | Freq: Once | INTRAMUSCULAR | Status: DC | PRN

## 2011-08-29 MED ORDER — CHLORHEXIDINE GLUCONATE CLOTH 2 % EX PADS
6.0000 | MEDICATED_PAD | Freq: Every day | CUTANEOUS | Status: DC
  Administered 2011-08-29: 6 via TOPICAL

## 2011-08-29 MED ORDER — LACTATED RINGERS IV SOLN: INTRAVENOUS | Status: DC

## 2011-08-29 MED ORDER — SIMETHICONE 80 MG PO CHEW: 80.0000 mg | CHEWABLE_TABLET | ORAL | Status: DC | PRN

## 2011-08-29 MED ORDER — DIPHENHYDRAMINE HCL 50 MG/ML IJ SOLN: 12.5000 mg | INTRAMUSCULAR | Status: DC | PRN

## 2011-08-29 MED ORDER — MAGNESIUM SULFATE BOLUS VIA INFUSION
4.0000 g | Freq: Once | INTRAVENOUS | Status: DC
  Filled 2011-08-29: qty 500

## 2011-08-29 MED ORDER — MORPHINE SULFATE (PF) 0.5 MG/ML IJ SOLN
INTRAMUSCULAR | Status: DC | PRN
  Administered 2011-08-29: 4 mg via EPIDURAL

## 2011-08-29 MED ORDER — DIPHENHYDRAMINE HCL 25 MG PO CAPS: 25.0000 mg | ORAL_CAPSULE | Freq: Four times a day (QID) | ORAL | Status: DC | PRN

## 2011-08-29 MED ORDER — PHENYLEPHRINE 40 MCG/ML (10ML) SYRINGE FOR IV PUSH (FOR BLOOD PRESSURE SUPPORT)
PREFILLED_SYRINGE | INTRAVENOUS | Status: AC
  Filled 2011-08-29: qty 10

## 2011-08-29 MED ORDER — FENTANYL CITRATE 0.05 MG/ML IJ SOLN
INTRAMUSCULAR | Status: DC | PRN
  Administered 2011-08-29: 100 ug via INTRAVENOUS

## 2011-08-29 MED ORDER — METOCLOPRAMIDE HCL 5 MG/ML IJ SOLN: 10.0000 mg | Freq: Once | INTRAMUSCULAR | Status: DC | PRN

## 2011-08-29 MED ORDER — LANOLIN HYDROUS EX OINT: 1.0000 "application " | TOPICAL_OINTMENT | CUTANEOUS | Status: DC | PRN

## 2011-08-29 MED ORDER — GENTAMICIN SULFATE 40 MG/ML IJ SOLN
INTRAVENOUS | Status: DC | PRN
  Administered 2011-08-29: 17:00:00 via INTRAVENOUS

## 2011-08-29 MED ORDER — PRENATAL MULTIVITAMIN CH: 1.0000 | ORAL_TABLET | Freq: Every day | ORAL | Status: DC

## 2011-08-29 MED ORDER — OXYTOCIN 10 UNIT/ML IJ SOLN
INTRAMUSCULAR | Status: AC
  Filled 2011-08-29: qty 4

## 2011-08-29 MED ORDER — MUPIROCIN 2 % EX OINT
1.0000 "application " | TOPICAL_OINTMENT | Freq: Two times a day (BID) | CUTANEOUS | Status: DC
  Administered 2011-08-29 – 2011-09-01 (×5): 1 via NASAL
  Filled 2011-08-29: qty 22

## 2011-08-29 MED ORDER — OXYTOCIN 10 UNIT/ML IJ SOLN
40.0000 [IU] | INTRAVENOUS | Status: DC | PRN
  Administered 2011-08-29: 40 [IU] via INTRAVENOUS

## 2011-08-29 SURGICAL SUPPLY — 35 items
BENZOIN TINCTURE PRP APPL 2/3 (GAUZE/BANDAGES/DRESSINGS) ×2 IMPLANT
CHLORAPREP W/TINT 26ML (MISCELLANEOUS) ×2 IMPLANT
CLOTH BEACON ORANGE TIMEOUT ST (SAFETY) ×2 IMPLANT
CONTAINER PREFILL 10% NBF 15ML (MISCELLANEOUS) IMPLANT
DRESSING TELFA 8X3 (GAUZE/BANDAGES/DRESSINGS) ×2 IMPLANT
DRSG COVADERM 4X10 (GAUZE/BANDAGES/DRESSINGS) ×2 IMPLANT
ELECT REM PT RETURN 9FT ADLT (ELECTROSURGICAL) ×2
ELECTRODE REM PT RTRN 9FT ADLT (ELECTROSURGICAL) ×1 IMPLANT
EXTRACTOR VACUUM M CUP 4 TUBE (SUCTIONS) IMPLANT
GLOVE BIO SURGEON STRL SZ 6.5 (GLOVE) ×2 IMPLANT
GLOVE BIO SURGEON STRL SZ7 (GLOVE) ×2 IMPLANT
GOWN PREVENTION PLUS LG XLONG (DISPOSABLE) ×6 IMPLANT
KIT ABG SYR 3ML LUER SLIP (SYRINGE) ×2 IMPLANT
NEEDLE HYPO 25X5/8 SAFETYGLIDE (NEEDLE) ×2 IMPLANT
NS IRRIG 1000ML POUR BTL (IV SOLUTION) ×2 IMPLANT
PACK C SECTION WH (CUSTOM PROCEDURE TRAY) ×2 IMPLANT
PAD ABD 7.5X8 STRL (GAUZE/BANDAGES/DRESSINGS) ×2 IMPLANT
RTRCTR C-SECT PINK 25CM LRG (MISCELLANEOUS) ×2 IMPLANT
SLEEVE SCD COMPRESS KNEE MED (MISCELLANEOUS) ×2 IMPLANT
STAPLER VISISTAT 35W (STAPLE) IMPLANT
STRIP CLOSURE SKIN 1/2X4 (GAUZE/BANDAGES/DRESSINGS) ×2 IMPLANT
SUT MNCRL 0 VIOLET CTX 36 (SUTURE) ×2 IMPLANT
SUT MONOCRYL 0 CTX 36 (SUTURE) ×2
SUT PLAIN 1 NONE 54 (SUTURE) IMPLANT
SUT PLAIN 2 0 XLH (SUTURE) ×2 IMPLANT
SUT VIC AB 0 CT1 27 (SUTURE) ×2
SUT VIC AB 0 CT1 27XBRD ANBCTR (SUTURE) ×2 IMPLANT
SUT VIC AB 2-0 CT1 27 (SUTURE) ×1
SUT VIC AB 2-0 CT1 TAPERPNT 27 (SUTURE) ×1 IMPLANT
SUT VIC AB 3-0 FS2 27 (SUTURE) ×2 IMPLANT
SYR BULB IRRIGATION 50ML (SYRINGE) IMPLANT
TAPE CLOTH SURG 4X10 WHT LF (GAUZE/BANDAGES/DRESSINGS) ×2 IMPLANT
TOWEL OR 17X24 6PK STRL BLUE (TOWEL DISPOSABLE) ×4 IMPLANT
TRAY FOLEY CATH 14FR (SET/KITS/TRAYS/PACK) ×2 IMPLANT
WATER STERILE IRR 1000ML POUR (IV SOLUTION) ×2 IMPLANT

## 2011-08-29 NOTE — Progress Notes (Signed)
Pt to AICU via stretcher from PACU. Pt oriented to AICU, visitation, and ELink. Family at bedside advised of same.

## 2011-08-29 NOTE — OR Nursing (Addendum)
Uterus massaged by S. Sherlonda Flater RN. Two tubes of cord blood sent to lab.  0cc of blood evacuated from uterus during uterine massage. 

## 2011-08-29 NOTE — Progress Notes (Signed)
RN to the bedside to assist with ADLs, pt. Desires to be removed from monitors ("for a break"); Dr. Ellyn Hack notified - in agreement ("for a short time"). Pt. Wants to take shower, but Dr. Malen Gauze not in agreement - pt. Will wash up at the sink - RN will assist with washing pt.'s hair. Pt's mother at the bedside.

## 2011-08-29 NOTE — Progress Notes (Signed)
Dr. Ellyn Hack at the bedside to discuss: chest discomfort (pt. Thinks due to anxiety), consent for c-section and daily evaluation.

## 2011-08-29 NOTE — Progress Notes (Signed)
Pt on OR table, pt. Being dosed via epidural.  [name] and Dr. Malen Gauze at the bedside.

## 2011-08-29 NOTE — Progress Notes (Signed)
Per Dr. Malen Gauze, Pt. May have clear liquid diet up until "noon or so". Pt. Currently eating a clear liquid diet.

## 2011-08-29 NOTE — Progress Notes (Signed)
Lab results called to Dr. Ellyn Hack, Dr. Ellyn Hack stated, she had already checked 1400 lab results.

## 2011-08-29 NOTE — Progress Notes (Signed)
Pt. Sitting - bending over pillows. FHR 120's, monitor recording maternal pulse.  Pt. States, "this position is comfortable". Spouse at the bedside supporting patient.

## 2011-08-29 NOTE — Progress Notes (Addendum)
Patient ID: Crystal Mitchell, female   DOB: [birth year], 36 y.o.   MRN: [id number]  35yo at 31+ with HELLP, s/p BMZ x 48hr at 4:30pm, plan for delivery then.  Platelets stable, likely starting to fall again, 149 to 148, will recheck at 2pm.  LFTs were 60-70 now 90's.  Will clean up before CS.  Ativan x 1 this AM, pt struggling with anxiety about situation.  Had NICU tour.  Mag for CP prophylaxis and sz prophylaxis.  +FM, no LOF, no VB, occ ctx  AF 150-160/80-90's gen anxious, NAD CV RRR Lungs CTAB Abd soft, FNT Ext sym NT FHTs 120's, mod var toco occ SVE cl/th/high  CS at 16:30 S/p NICU tour, has dry cath placed Ativan 0.5 x 1 Clears until noon

## 2011-08-29 NOTE — Progress Notes (Signed)
Patient ID: Crystal Mitchell, female   DOB: 1975/06/11, 36 y.o.   MRN: [id number]  Pt scheduled for LTCS secondary to HELLP at 4:30, 48 hr after steroids.  Pt states uncomfortable, Right shoulder pain, RUQ pain, epigastric pain.  Uncomfortable wear catheter placed.  Swelling FHTs 125 mod var toco occ  Reviewed with pt r/b/a of LTCS, what to expect Reviewed with patient, baby to NICU and NICU care Reviewed with patient, magnesium after delivery and continued following labs

## 2011-08-29 NOTE — Anesthesia Postprocedure Evaluation (Signed)
  Anesthesia Post-op Note  Patient: Crystal Mitchell  Procedure(s) Performed: Procedure(s) (LRB): CESAREAN SECTION (N/A)  Patient Location: PACU  Anesthesia Type: Epidural  Level of Consciousness: awake, alert  and oriented  Airway and Oxygen Therapy: Patient Spontanous Breathing  Post-op Pain: mild  Post-op Assessment: Post-op Vital signs reviewed, Patient's Cardiovascular Status Stable, Respiratory Function Stable, Patent Airway, No signs of Nausea or vomiting, Pain level controlled, No headache, No backache, No residual numbness and No residual motor weakness. Epidural Catheter left in place as patient's platelet count post op dropped to 66k.  Post-op Vital Signs: Reviewed and stable  Complications: No apparent anesthesia complications

## 2011-08-29 NOTE — Progress Notes (Signed)
Late Note: Mag. Sulfate discontinued prior to going to the OR.

## 2011-08-29 NOTE — Brief Op Note (Signed)
08/27/2011 - 08/29/2011  5:50 PM  PATIENT:  Crystal Mitchell  36 y.o. female  PRE-OPERATIVE DIAGNOSIS:  HELLP syndrome, IUP at 31 week  POST-OPERATIVE DIAGNOSIS:  HELLP syndrome/marginal placental abruption/ IUP @ 31 week  PROCEDURE:  Procedure(s) (LRB): CESAREAN SECTION (N/A)  SURGEON:  Surgeon(s) and Role:    * Sherron Monday, MD - Primary  ANESTHESIA:   epidural  FINDINGS: viable female infant at 17:04, apgars P, wt P, nl uterus, tubes and ovaries.  Cord pH = 7.28  EBL:  Total I/O In: 1680 [P.O.:380; I.V.:1300] Out: 2500 [Urine:1800; Blood:700]  BLOOD ADMINISTERED:none  DRAINS: Urinary Catheter (Foley)   LOCAL MEDICATIONS USED:  NONE  SPECIMEN:  Source of Specimen:  Placenta  DISPOSITION OF SPECIMEN:  PATHOLOGY  COUNTS:  YES  TOURNIQUET:  * No tourniquets in log *  DICTATION: .Other Dictation: Dictation Number [id number]  PLAN OF CARE: Admit to inpatient   PATIENT DISPOSITION:  PACU - hemodynamically stable.   Delay start of Pharmacological VTE agent (>24hrs) due to surgical blood loss or risk of bleeding: not applicable

## 2011-08-29 NOTE — Transfer of Care (Signed)
Immediate Anesthesia Transfer of Care Note  Patient: Crystal Mitchell  Procedure(s) Performed: Procedure(s) (LRB): CESAREAN SECTION (N/A)  Patient Location: PACU  Anesthesia Type: Epidural  Level of Consciousness: oriented and sedated  Airway & Oxygen Therapy: Patient Spontanous Breathing  Post-op Assessment: Report given to PACU RN and Post -op Vital signs reviewed and stable  Post vital signs: stable  Complications: No apparent anesthesia complications

## 2011-08-29 NOTE — Progress Notes (Addendum)
Pt uncomfortable - unable to obtain continuous fetal monitoring. Hot compress applied to left shoulder discomfort. Dry epidural in place - pt. Uncomfortable.  C/o of left shoulder pain, unable to get comfortable with catheter in lower back. Dr. Malen Gauze notified of pt's discomfort. RN will continue to monitor.  Pt's mother at the bedside.

## 2011-08-30 LAB — COMPREHENSIVE METABOLIC PANEL
ALT: 594 U/L — ABNORMAL HIGH (ref 0–35)
AST: 1287 U/L — ABNORMAL HIGH (ref 0–37)
AST: 1374 U/L — ABNORMAL HIGH (ref 0–37)
Albumin: 2.1 g/dL — ABNORMAL LOW (ref 3.5–5.2)
Albumin: 2 g/dL — ABNORMAL LOW (ref 3.5–5.2)
Alkaline Phosphatase: 127 U/L — ABNORMAL HIGH (ref 39–117)
BUN: 22 mg/dL (ref 6–23)
CO2: 25 mEq/L (ref 19–32)
Calcium: 6.8 mg/dL — ABNORMAL LOW (ref 8.4–10.5)
Chloride: 93 mEq/L — ABNORMAL LOW (ref 96–112)
GFR calc non Af Amer: 77 mL/min — ABNORMAL LOW (ref >90)
Potassium: 4.9 mEq/L (ref 3.5–5.1)
Sodium: 129 mEq/L — ABNORMAL LOW (ref 135–145)
Total Bilirubin: 3.4 mg/dL — ABNORMAL HIGH (ref 0.3–1.2)
Total Protein: 4.7 g/dL — ABNORMAL LOW (ref 6.0–8.3)

## 2011-08-30 LAB — CBC
HCT: 28.1 % — ABNORMAL LOW (ref 36.0–46.0)
MCH: 32.7 pg (ref 26.0–34.0)
Platelets: 31 10*3/uL — ABNORMAL LOW (ref 150–400)
Platelets: 32 10*3/uL — ABNORMAL LOW (ref 150–400)
RBC: 3.21 MIL/uL — ABNORMAL LOW (ref 3.87–5.11)
RDW: 14 % (ref 11.5–15.5)
RDW: 14.6 % (ref 11.5–15.5)
WBC: 17.9 10*3/uL — ABNORMAL HIGH (ref 4.0–10.5)

## 2011-08-30 MED ORDER — CHLORHEXIDINE GLUCONATE CLOTH 2 % EX PADS
6.0000 | MEDICATED_PAD | Freq: Every day | CUTANEOUS | Status: DC
  Administered 2011-08-30 – 2011-09-01 (×2): 6 via TOPICAL

## 2011-08-30 NOTE — Progress Notes (Signed)
Subjective: Postpartum Day 1: Cesarean Delivery Patient reports incisional pain and tolerating PO.  "groggy" on magnesium, feels tired  Objective: Vital signs in last 24 hours: Temp:  [97.4 F (36.3 C)-98.3 F (36.8 C)] 98.3 F (36.8 C) (07/21 0745) Pulse Rate:  [67-95] 77  (07/21 1000) Resp:  [15-22] 22  (07/21 1000) BP: (128-169)/(64-111) 137/89 mmHg (07/21 1000) SpO2:  [92 %-100 %] 95 % (07/21 1000) Weight:  [77.474 kg (170 lb 12.8 oz)-78.563 kg (173 lb 3.2 oz)] 78.563 kg (173 lb 3.2 oz) (07/21 0600)  Physical Exam:  General: alert, fatigued and no distress Lochia: appropriate Uterine Fundus: firm Incision: healing well DVT Evaluation: No evidence of DVT seen on physical exam. Baby doing well in NICU on RA, starting feeds today!  Basename 08/30/11 0507 08/29/11 1805  HGB 10.5* 10.7*  HCT 28.7* 30.3*  plt 31, LFTs alevated  Assessment/Plan: Status post Cesarean section. Doing well postoperatively.  Continue current care.  Decrease magnesium to 1gm/hr, check level; recheck CBC, CMP at 5pm, likely d/c mag at 5 pm.  Good uop.    Mitchell,Crystal Bureau 08/30/2011, 10:50 AM

## 2011-08-30 NOTE — Addendum Note (Signed)
Addendum  created 08/30/11 0934 by Earmon Phoenix, CRNA   Modules edited:Notes Section

## 2011-08-30 NOTE — Progress Notes (Signed)
1240 Rec'd critical magnesium level = 6.4 from laboratory. 1245 Dr Ellyn Hack notified - no new orders - will continue magnesium until 1700 labs resulted.

## 2011-08-30 NOTE — Anesthesia Postprocedure Evaluation (Signed)
  Anesthesia Post-op Note  Patient: Crystal Mitchell  Procedure(s) Performed: Procedure(s) (LRB): CESAREAN SECTION (N/A)  Patient Location: ICU  Anesthesia Type: Epidural  Level of Consciousness: awake and oriented, drowsy on MgSO4.  Airway and Oxygen Therapy: Patient Spontanous Breathing  Post-op Pain: mild  Post-op Assessment: Patient's Cardiovascular Status Stable and Respiratory Function Stable  Post-op Vital Signs: stable  Complications: No apparent anesthesia complications, epidural intact due to low plts of 31,000 this morning.  Will recheck tomorrow.

## 2011-08-30 NOTE — Op Note (Signed)
NAME:  Crystal Mitchell, Crystal Mitchell               ACCOUNT NO.:  [id number]  MEDICAL RECORD NO.:  [id number]  LOCATION:  9372                          FACILITY:  [facility]  PHYSICIAN:  Sherron Monday, MD        DATE OF BIRTH:  1975/11/28  DATE OF PROCEDURE:  08/29/2011 DATE OF DISCHARGE:                              OPERATIVE REPORT   PREOPERATIVE DIAGNOSIS:  HELLP syndrome, intrauterine pregnancy at 31 weeks.  POSTOPERATIVE DIAGNOSIS:  HELLP syndrome, marginal placental abruption, intrauterine pregnancy at 31 weeks.  PROCEDURE:  Low transverse cesarean section.  SURGEON:  Sherron Monday, MD  ANESTHESIA:  Epidural.  FINDINGS:  Viable female infant at 66 with Apgars pending at time of dictation as well as weight.  Normal uterus, tubes, and ovaries were noted.  Cord pH was 7.28.  ESTIMATED BLOOD LOSS:  700 mL.  IV FLUIDS:  .  URINE OUTPUT:  300 mL of clear urine at the end of the procedure.  COMPLICATIONS:  None.  PATHOLGY:  Placenta to pathology.  PROCEDURE:  After informed consent was reviewed with the patient including risks, benefits, and alternatives of the surgical procedure, she was transported to the OR where her epidural was re-dosed and found be adequate.  She was then prepped and draped in the normal sterile fashion.  Foley catheter was sterilely placed.  Pfannenstiel skin incision was made at the level approximately 2 fingerbreadths above the pubic symphysis, carried through the underlying layer of fascia sharply. The fascia was incised in the midline.  The incision was extended laterally with Mayo scissors.  The inferior aspect of the fascial incision was grasped with Kocher clamps, elevated and the rectus muscles were dissected off both bluntly and sharply.  Attention was turned to the superior portion which in a similar fashion was grasped with Kocher clamps, elevated, and the rectus muscles were dissected off both bluntly and sharply.  Midline was easily identified.  The  peritoneum was entered bluntly.  This incision was extended with good visualization of the bladder.  The Alexis skin retractor was placed carefully making sure no bowel was entrapped.  Vesicouterine peritoneum was easily identified, tented up, and dissected.  The bladder flap was created both digitally and sharply.  Uterus was incised in a transverse fashion.  Infant was delivered from a vertex presentation.  On rupture of membranes, bloody fluid was noted.  Infant was delivered with the aid of a vacuum.  The nose and mouth were suctioned.  Cord was clamped and cut.  The baby was handed off to the awaiting pediatric staff.  Placenta was expressed from the uterus at the time of final impression .  Several enlarged dark clots were noted.  Uterus was cleared of all clot and debris.  The uterine incision was closed with 2 layers of 0 Monocryl the firs1st of which was running and locked and the second imbricating layer and found to be hemostatic.  Copious pelvic irrigation was performed.  The peritoneum was reapproximated using 2-0 Vicryl in a running fashion. The fascia was closed with 0 Vicryl in a single stitch suture and subcuticular adipose layer was made hemostatic with Bovie cautery.  The dead space was reapproximated  with plain gut suture.  The skin was closed in subcuticular fashion with 3-0 Vicryl.  Benzoin and Steri- Strips.  The patient tolerated procedure well.  Sponge, lap, and needle counts were correct x2 at the end of the procedure.     Sherron Monday, MD     JB/MEDQ  D:  08/29/2011  T:  08/30/2011  Job:  [id number]

## 2011-08-31 ENCOUNTER — Encounter (HOSPITAL_COMMUNITY): Payer: Self-pay | Admitting: Obstetrics and Gynecology

## 2011-08-31 DIAGNOSIS — Z98891 History of uterine scar from previous surgery: Secondary | ICD-10-CM

## 2011-08-31 DIAGNOSIS — O1420 HELLP syndrome (HELLP), unspecified trimester: Secondary | ICD-10-CM

## 2011-08-31 HISTORY — DX: HELLP syndrome (HELLP), unspecified trimester: O14.20

## 2011-08-31 HISTORY — DX: History of uterine scar from previous surgery: Z98.891

## 2011-08-31 LAB — COMPREHENSIVE METABOLIC PANEL
Albumin: 1.9 g/dL — ABNORMAL LOW (ref 3.5–5.2)
BUN: 21 mg/dL (ref 6–23)
Creatinine, Ser: 0.86 mg/dL (ref 0.50–1.10)
Total Bilirubin: 1.4 mg/dL — ABNORMAL HIGH (ref 0.3–1.2)
Total Protein: 4.6 g/dL — ABNORMAL LOW (ref 6.0–8.3)

## 2011-08-31 LAB — CBC
HCT: 25.2 % — ABNORMAL LOW (ref 36.0–46.0)
MCHC: 35.7 g/dL (ref 30.0–36.0)
MCV: 89 fL (ref 78.0–100.0)
RDW: 14.8 % (ref 11.5–15.5)

## 2011-08-31 MED ORDER — SODIUM CHLORIDE 0.9 % IJ SOLN: 3.0000 mL | INTRAMUSCULAR | Status: DC | PRN

## 2011-08-31 MED ORDER — SODIUM CHLORIDE 0.9 % IJ SOLN
3.0000 mL | Freq: Two times a day (BID) | INTRAMUSCULAR | Status: DC
  Administered 2011-08-31: 3 mL via INTRAVENOUS

## 2011-08-31 NOTE — Progress Notes (Signed)
Subjective: Postpartum Day 2: Cesarean Delivery Patient reports incisional pain, tolerating PO and no problems voiding.  Pain controlled, nl lochia  Objective: Vital signs in last 24 hours: Temp:  [98 F (36.7 C)-98.5 F (36.9 C)] 98.4 F (36.9 C) (07/22 0300) Pulse Rate:  [72-94] 74  (07/22 0700) Resp:  [18-22] 18  (07/22 0500) BP: (134-150)/(68-91) 134/69 mmHg (07/22 0700) SpO2:  [94 %-98 %] 97 % (07/22 0700) Weight:  [79.062 kg (174 lb 4.8 oz)] 79.062 kg (174 lb 4.8 oz) (07/22 0700)  Physical Exam:  General: alert and no distress Lochia: appropriate Uterine Fundus: firm Incision: healing well DVT Evaluation: No evidence of DVT seen on physical exam.   Basename 08/31/11 0519 08/30/11 1708  HGB 9.0* 10.3*  HCT 25.2* 28.1*    Assessment/Plan: Status post Cesarean section. Doing well postoperatively.  Continue current care, transfer to postpartum.  Plts increasing, LFTs decreasing.  Baby doing well.  BP well controlled without meds, Magnesium d/c'd.  Baby doing well, RA, starting feed, phototherapy, no abx.    BOVARD,Ravon Mcilhenny 08/31/2011, 8:24 AM

## 2011-08-31 NOTE — Progress Notes (Signed)
UR chart review completed.  

## 2011-09-01 ENCOUNTER — Encounter (HOSPITAL_COMMUNITY)
Admission: RE | Admit: 2011-09-01 | Discharge: 2011-09-01 | Disposition: A | Discharge status: Home / Self Care | Payer: BC Managed Care – PPO | Source: Ambulatory Visit | Attending: Obstetrics and Gynecology | Admitting: Obstetrics and Gynecology

## 2011-09-01 DIAGNOSIS — O923 Agalactia: Secondary | ICD-10-CM | POA: Insufficient documentation

## 2011-09-01 LAB — COMPREHENSIVE METABOLIC PANEL
AST: 165 U/L — ABNORMAL HIGH (ref 0–37)
Alkaline Phosphatase: 118 U/L — ABNORMAL HIGH (ref 39–117)
CO2: 26 mEq/L (ref 19–32)
Chloride: 102 mEq/L (ref 96–112)
Creatinine, Ser: 0.76 mg/dL (ref 0.50–1.10)
GFR calc non Af Amer: >90 mL/min (ref >90)
Potassium: 4.1 mEq/L (ref 3.5–5.1)
Total Bilirubin: 0.8 mg/dL (ref 0.3–1.2)

## 2011-09-01 LAB — CBC
HCT: 23.6 % — ABNORMAL LOW (ref 36.0–46.0)
MCV: 91.1 fL (ref 78.0–100.0)
Platelets: 108 10*3/uL — ABNORMAL LOW (ref 150–400)
RBC: 2.59 MIL/uL — ABNORMAL LOW (ref 3.87–5.11)
WBC: 18.1 10*3/uL — ABNORMAL HIGH (ref 4.0–10.5)

## 2011-09-01 MED ORDER — PRENATAL MULTIVITAMIN CH: 1.0000 | ORAL_TABLET | Freq: Every day | ORAL | Status: DC

## 2011-09-01 MED ORDER — OXYCODONE-ACETAMINOPHEN 5-325 MG PO TABS: 1.0000 | ORAL_TABLET | Freq: Four times a day (QID) | ORAL | Status: AC | PRN

## 2011-09-01 MED ORDER — IBUPROFEN 800 MG PO TABS: 800.0000 mg | ORAL_TABLET | Freq: Three times a day (TID) | ORAL | Status: AC

## 2011-09-01 NOTE — Progress Notes (Addendum)
Subjective: Postpartum Day 3: Cesarean Delivery Patient reports incisional pain, tolerating PO and no problems voiding.  Pain controlled, nl lochia  Objective: Vital signs in last 24 hours: Temp:  [98.1 F (36.7 C)-98.9 F (37.2 C)] 98.1 F (36.7 C) (07/23 1610) Pulse Rate:  [70-100] 73  (07/23 0611) Resp:  [18-20] 19  (07/23 0611) BP: (140-151)/(73-92) 151/92 mmHg (07/23 0611) SpO2:  [96 %-99 %] 97 % (07/23 9604)  Physical Exam:  General: alert and no distress Lochia: appropriate Uterine Fundus: firm Incision: healing well DVT Evaluation: No evidence of DVT seen on physical exam.   Basename 09/01/11 0543 08/31/11 0519  HGB 8.2* 9.0*  HCT 23.6* 25.2*  Na to 135, Plts = 108, LFTs falling to 100 and 300.  Assessment/Plan: Status post Cesarean section. Doing well postoperatively.  Discharge home with standard precautions and return to clinic in 2 weeks.  D/c with Motrin/percocet/pnv.  Will recheck labs at [facility] visit  BOVARD,Quran Vasco 09/01/2011, 8:42 AM

## 2011-09-01 NOTE — Anesthesia Postprocedure Evaluation (Signed)
  Anesthesia Post-op Note  Patient: Crystal Mitchell  Procedure(s) Performed: Procedure(s) (LRB): CESAREAN SECTION (N/A)  Patient Location: PACU and Women's Unit  Anesthesia Type: Epidural  Level of Consciousness: awake, alert  and oriented  Airway and Oxygen Therapy: Patient Spontanous Breathing  Post-op Pain: mild  Post-op Assessment: Patient's Cardiovascular Status Stable, Respiratory Function Stable, No signs of Nausea or vomiting, Adequate PO intake and Pain level controlled  Post-op Vital Signs: stable  Complications: No apparent anesthesia complicationsPlatelet count 108. Epidural catheter pulled at Dr. Edison Pace request , tip intact.

## 2011-09-01 NOTE — Addendum Note (Signed)
Addendum  created 09/01/11 0816 by Elbert Ewings, CRNA   Modules edited:Notes Section

## 2011-09-01 NOTE — Discharge Summary (Signed)
Obstetric Discharge Summary Reason for Admission: HELLP syndrome, IUP at 31+ Prenatal Procedures: NST, ultrasound and labs, BMZ Intrapartum Procedures: cesarean: low cervical, transverse Postpartum Procedures: Magnesium Sulfate Complications-Operative and Postpartum: none Hemoglobin  Date Value Range Status  09/01/2011 8.2* 12.0 - 15.0 g/dL Final     HCT  Date Value Range Status  09/01/2011 23.6* 36.0 - 46.0 % Final    Physical Exam:  General: alert and no distress Lochia: appropriate Uterine Fundus: firm Incision: healing well DVT Evaluation: No evidence of DVT seen on physical exam.  Discharge Diagnoses: HELLP, 31+ wk pregnancy delivered by LTCS  Discharge Information: Date: 09/01/2011 Activity: pelvic rest Diet: routine Medications: PNV, Ibuprofen and Percocet Condition: stable Instructions: refer to practice specific booklet Discharge to: home Follow-up Information    Follow up with BOVARD,Jaslyne Beeck, MD. Schedule an appointment as soon as possible for a visit in 2 weeks.   Contact information:   [address] [facility] [address] [place] [address] [phone]          Newborn Data: Live born female  Birth Weight: 3 lb 4.6 oz (1491 g) APGAR: 3, 8  Home with NICU on RA.  BOVARD,Xadrian Craighead 09/01/2011, 8:48 AM

## 2011-10-02 ENCOUNTER — Encounter (HOSPITAL_COMMUNITY)
Admission: RE | Admit: 2011-10-02 | Discharge: 2011-10-02 | Disposition: A | Discharge status: Home / Self Care | Payer: BC Managed Care – PPO | Source: Ambulatory Visit | Attending: Obstetrics and Gynecology | Admitting: Obstetrics and Gynecology

## 2011-10-02 DIAGNOSIS — O923 Agalactia: Secondary | ICD-10-CM | POA: Insufficient documentation

## 2011-11-02 ENCOUNTER — Encounter (HOSPITAL_COMMUNITY)
Admission: RE | Admit: 2011-11-02 | Discharge: 2011-11-02 | Disposition: A | Discharge status: Home / Self Care | Payer: BC Managed Care – PPO | Source: Ambulatory Visit | Attending: Obstetrics and Gynecology | Admitting: Obstetrics and Gynecology

## 2011-11-02 DIAGNOSIS — O923 Agalactia: Secondary | ICD-10-CM | POA: Insufficient documentation

## 2011-12-03 ENCOUNTER — Encounter (HOSPITAL_COMMUNITY)
Admission: RE | Admit: 2011-12-03 | Discharge: 2011-12-03 | Disposition: A | Discharge status: Home / Self Care | Payer: BC Managed Care – PPO | Source: Ambulatory Visit | Attending: Obstetrics and Gynecology | Admitting: Obstetrics and Gynecology

## 2011-12-03 DIAGNOSIS — O923 Agalactia: Secondary | ICD-10-CM | POA: Insufficient documentation

## 2012-01-03 ENCOUNTER — Encounter (HOSPITAL_COMMUNITY)
Admission: RE | Admit: 2012-01-03 | Discharge: 2012-01-03 | Disposition: A | Discharge status: Home / Self Care | Payer: BC Managed Care – PPO | Source: Ambulatory Visit | Attending: Obstetrics and Gynecology | Admitting: Obstetrics and Gynecology

## 2012-01-03 DIAGNOSIS — O923 Agalactia: Secondary | ICD-10-CM | POA: Insufficient documentation

## 2012-02-03 ENCOUNTER — Encounter (HOSPITAL_COMMUNITY)
Admission: RE | Admit: 2012-02-03 | Discharge: 2012-02-03 | Disposition: A | Discharge status: Home / Self Care | Payer: BC Managed Care – PPO | Source: Ambulatory Visit | Attending: Obstetrics and Gynecology | Admitting: Obstetrics and Gynecology

## 2012-02-03 DIAGNOSIS — O923 Agalactia: Secondary | ICD-10-CM | POA: Insufficient documentation

## 2012-03-05 ENCOUNTER — Encounter (HOSPITAL_COMMUNITY)
Admission: RE | Admit: 2012-03-05 | Discharge: 2012-03-05 | Disposition: A | Discharge status: Home / Self Care | Payer: BC Managed Care – PPO | Source: Ambulatory Visit | Attending: Obstetrics and Gynecology | Admitting: Obstetrics and Gynecology

## 2012-03-05 DIAGNOSIS — O923 Agalactia: Secondary | ICD-10-CM | POA: Insufficient documentation

## 2013-10-08 IMAGING — US US OB COMP +14 WK
1 series · 12 of 28 positions shown · non-contrast
Comparison: none

[Series 1: us ob comp +14 wk · 54 acquisitions, 12 frames shown]
[im 2/54]
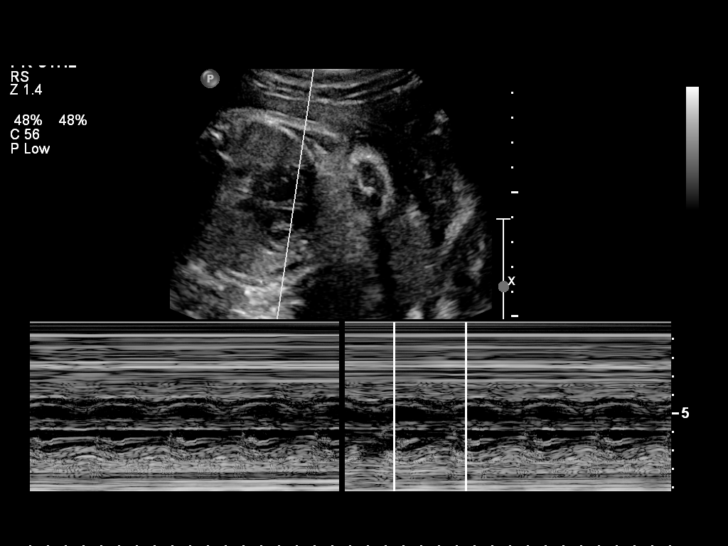
[im 6/54]
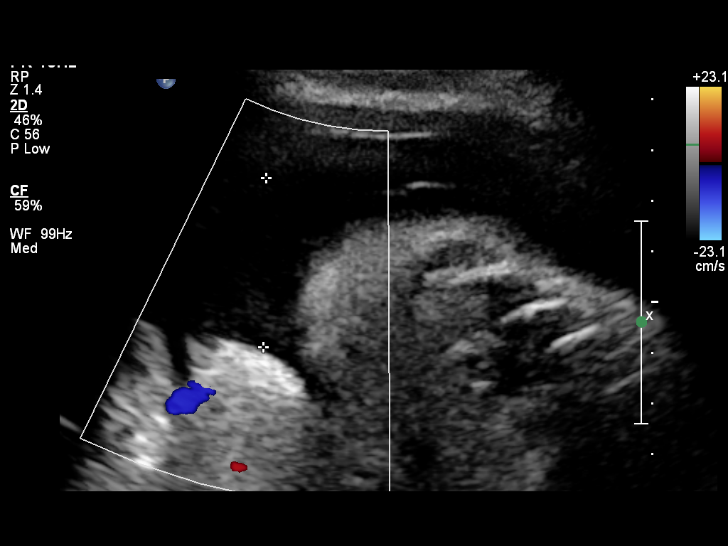
[im 10/54]
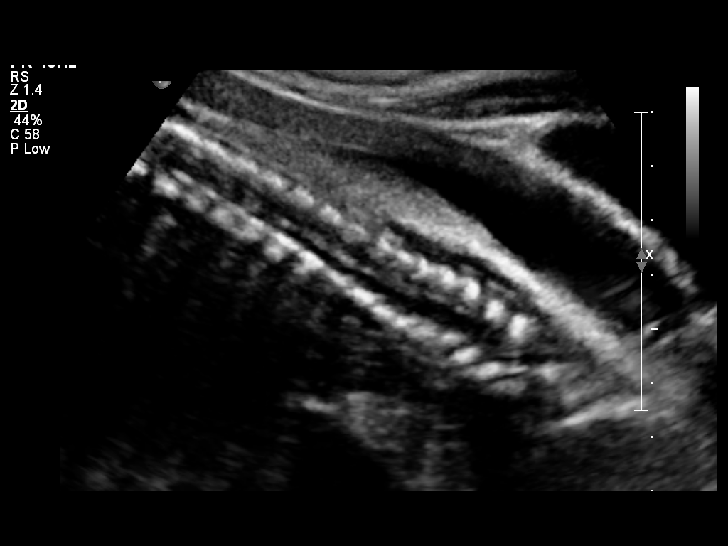
[im 16/54]
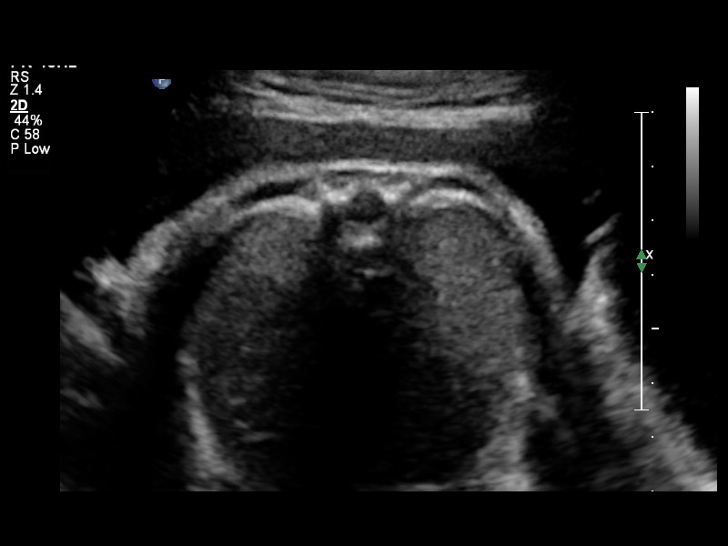
[im 20/54]
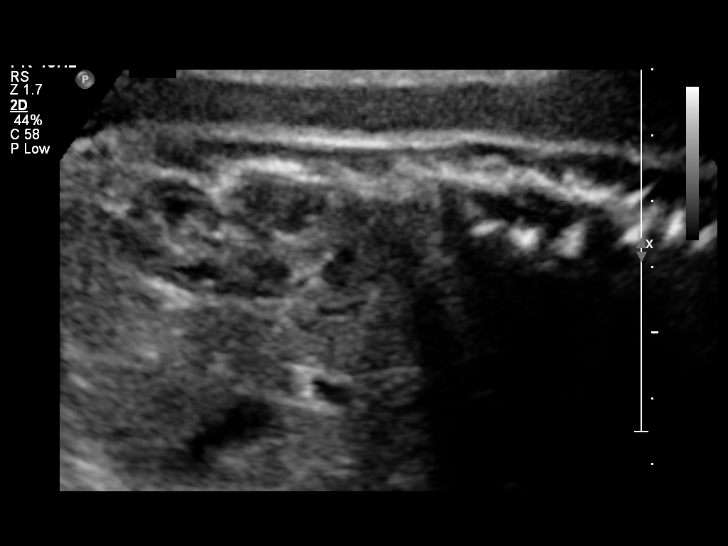
[im 24/54]
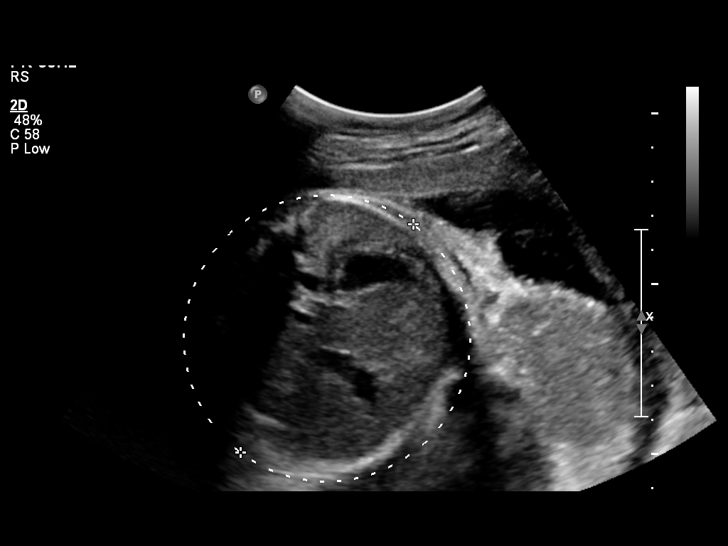
[im 30/54]
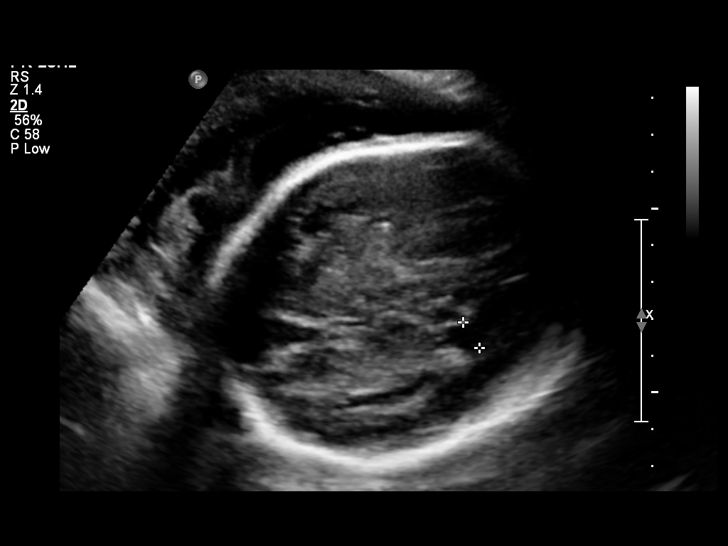
[im 34/54]
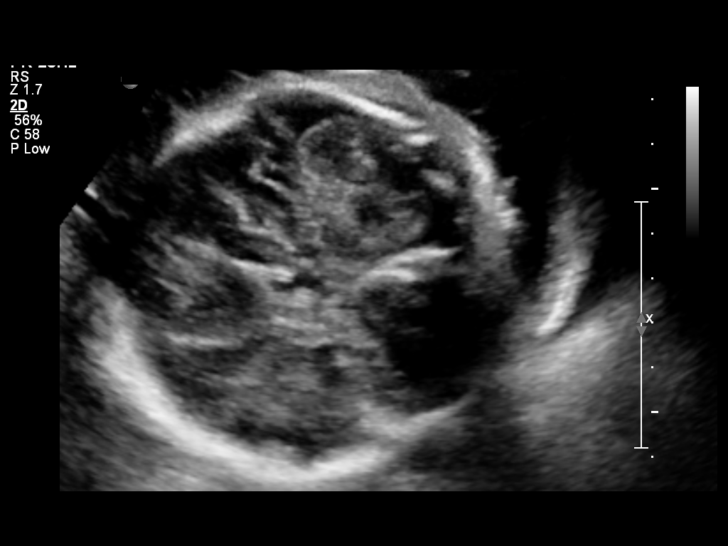
[im 38/54]
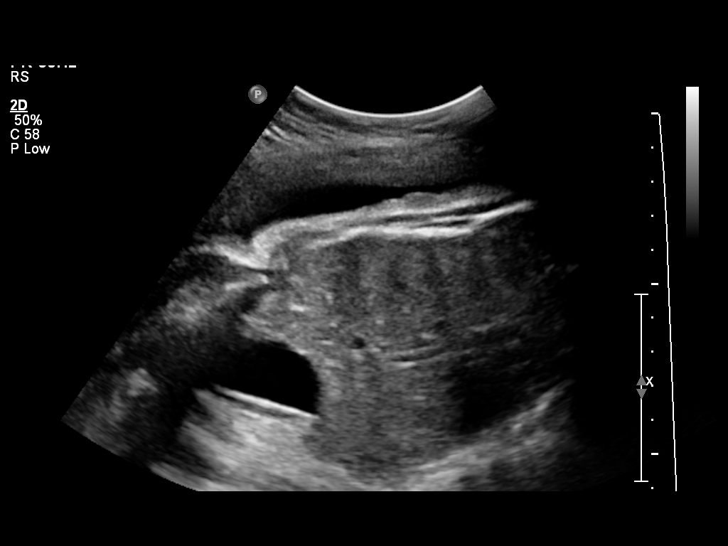
[im 44/54]
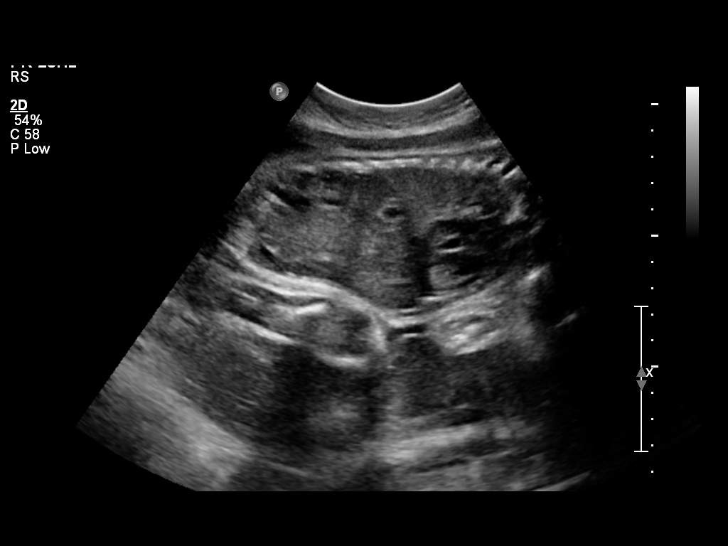
[im 48/54]
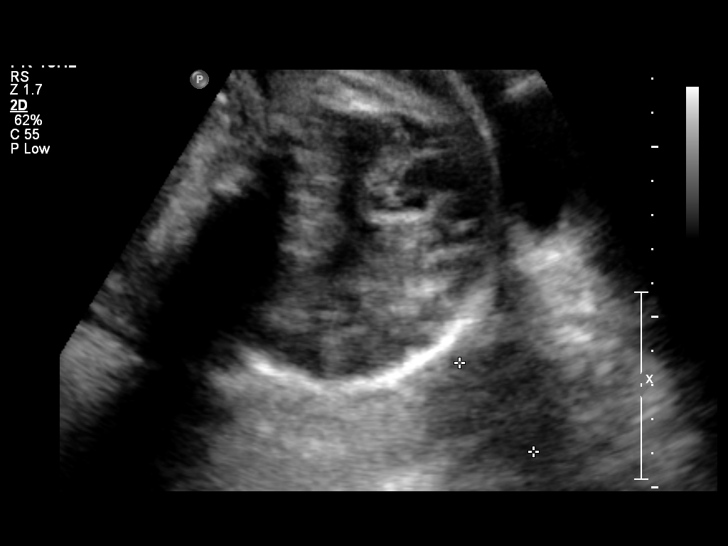
[im 52/54]
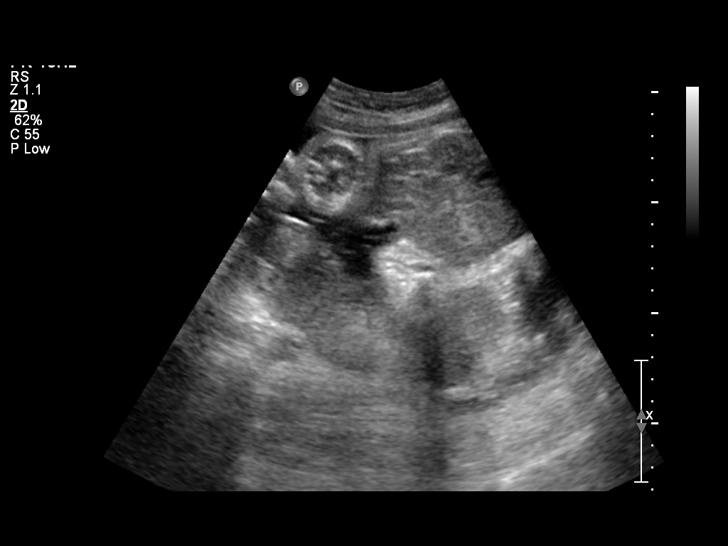

[12 of 28 positions shown; findings below may reference images not displayed]

OBSTETRICS REPORT
                      (Signed Final 08/27/2011 [DATE])

 Order#:         [id number]
Procedures

 US OB COMP + 14 WK                                    76805.1
Indications

 Hypertension - Severe preeclampsia + HELLP
 Basic anatomic survey
Fetal Evaluation

 Fetal Heart Rate:  149                         bpm
 Cardiac Activity:  Observed
 Presentation:      Cephalic
 Placenta:          Posterior, above cervical
                    os
 P. Cord            Not well visualized
 Insertion:

 Amniotic Fluid
 AFI FV:      Subjectively within normal limits
 AFI Sum:     11.61   cm      28   %Tile     Larg Pckt:   3.32   cm
 RUQ:   3.14   cm    RLQ:    3.32   cm    LUQ:   3.25    cm   LLQ:    1.9    cm
Biometry

 BPD:     83.9  mm    G. Age:   33w 5d                CI:           78   70 - 86
                                                      FL/HC:      19.4   19.3 -

 HC:     300.6  mm    G. Age:   33w 3d       70  %    HC/AC:      1.17   0.96 -

 AC:     257.5  mm    G. Age:   30w 0d       13  %    FL/BPD:     69.6   71 - 87
 FL:      58.4  mm    G. Age:   30w 4d       18  %    FL/AC:      22.7   20 - 24
 CER:     34.8  mm    G. Age:   29w 6d       32  %

 Est. FW:    5603  gm      3 lb 9 oz     44  %
Gestational Age

 LMP:           31w 2d       Date:   01/20/11                 EDD:   10/27/11
 U/S Today:     31w 6d                                        EDD:   10/23/11
 Best:          31w 2d    Det. By:   LMP  (01/20/11)          EDD:   10/27/11
Anatomy
 Cranium:           Appears normal      Aortic Arch:       Basic anatomy
                                                           exam per order
 Fetal Cavum:       Not well            Ductal Arch:       Basic anatomy
                    visualized                             exam per order
 Ventricles:        Appears normal      Diaphragm:         Appears normal
 Choroid Plexus:    Appears normal      Stomach:           Appears
                                                           normal, left
                                                           sided
 Cerebellum:        Appears normal      Abdomen:           Appears normal
 Posterior Fossa:   Appears normal      Abdominal Wall:    Not well
                                                           visualized
 Nuchal Fold:       Not applicable      Cord Vessels:      Appears normal
                    (>20 wks GA)                           (3 vessel cord)
 Face:              Not well            Kidneys:           Appear normal
                    visualized
 Heart:             Not well            Bladder:           Appears normal
                    visualized
 RVOT:              Not well            Spine:             Appears normal
                    visualized
 LVOT:              Not well            Limbs:             Not well
                    visualized                             visualized

 Other:     Technically difficult due to advanced GA and fetal
            position.
Targeted Anatomy

 Fetal Central Nervous System
 Lat. Ventricles:   7.3                 Cisterna Magna:
Cervix Uterus Adnexa

 Cervical Length:   3.37      cm

 Cervix:       Normal appearance by transabdominal scan.

 Adnexa:     No abnormality visualized.
Impression

 Single live IUP in cephalic presentation.  Concordant
 measurements/assigned GA by LMP.
 No late-developing anomaly in visualized structures above.

## 2013-12-11 ENCOUNTER — Encounter (HOSPITAL_COMMUNITY): Payer: Self-pay | Admitting: Obstetrics and Gynecology

## 2015-07-09 ENCOUNTER — Ambulatory Visit (INDEPENDENT_AMBULATORY_CARE_PROVIDER_SITE_OTHER): Payer: BLUE CROSS/BLUE SHIELD | Admitting: Internal Medicine

## 2015-07-09 ENCOUNTER — Encounter: Payer: Self-pay | Admitting: Internal Medicine

## 2015-07-09 ENCOUNTER — Other Ambulatory Visit (INDEPENDENT_AMBULATORY_CARE_PROVIDER_SITE_OTHER): Payer: BLUE CROSS/BLUE SHIELD

## 2015-07-09 VITALS — BP 118/62 | HR 66 | Temp 98.6°F | Resp 14 | Ht 64.0 in | Wt 144.1 lb

## 2015-07-09 DIAGNOSIS — Z0000 Encounter for general adult medical examination without abnormal findings: Secondary | ICD-10-CM | POA: Insufficient documentation

## 2015-07-09 LAB — COMPREHENSIVE METABOLIC PANEL
ALBUMIN: 4.7 g/dL (ref 3.5–5.2)
ALK PHOS: 22 U/L — AB (ref 39–117)
ALT: 8 U/L (ref 0–35)
AST: 12 U/L (ref 0–37)
BUN: 17 mg/dL (ref 6–23)
CHLORIDE: 105 meq/L (ref 96–112)
CO2: 26 mEq/L (ref 19–32)
CREATININE: 0.78 mg/dL (ref 0.40–1.20)
Calcium: 9.9 mg/dL (ref 8.4–10.5)
GFR: 87.54 mL/min (ref >60.00)
Glucose, Bld: 88 mg/dL (ref 70–99)
Potassium: 4.7 mEq/L (ref 3.5–5.1)
SODIUM: 138 meq/L (ref 135–145)
TOTAL PROTEIN: 7.1 g/dL (ref 6.0–8.3)
Total Bilirubin: 0.6 mg/dL (ref 0.2–1.2)

## 2015-07-09 LAB — CBC
HEMATOCRIT: 39.8 % (ref 36.0–46.0)
Hemoglobin: 13.7 g/dL (ref 12.0–15.0)
MCHC: 34.5 g/dL (ref 30.0–36.0)
MCV: 89.4 fl (ref 78.0–100.0)
Platelets: 290 10*3/uL (ref 150.0–400.0)
RBC: 4.45 Mil/uL (ref 3.87–5.11)
RDW: 12.5 % (ref 11.5–15.5)
WBC: 5.9 10*3/uL (ref 4.0–10.5)

## 2015-07-09 LAB — LIPID PANEL
CHOLESTEROL: 221 mg/dL — AB (ref 0–200)
HDL: 62.8 mg/dL (ref >39.00)
LDL Cholesterol: 143 mg/dL — ABNORMAL HIGH (ref 0–99)
NonHDL: 158.32
Total CHOL/HDL Ratio: 4
Triglycerides: 77 mg/dL (ref 0.0–149.0)
VLDL: 15.4 mg/dL (ref 0.0–40.0)

## 2015-07-09 NOTE — Progress Notes (Signed)
   Subjective:    Patient ID: Crystal StabileKaren Ellen Spring, female    DOB: [birth year], 40 y.o.   MRN: [id number]  HPI The patient is a new 40 YO female coming in for wellness. No new concerns, some off and on right ankle pain which generally resolves with wearing good shoes and worsens if she wears shoes without support.   PM[facility], [facility], social history reviewed and updated.   Review of Systems  Constitutional: Negative for fever, activity change, appetite change, fatigue and unexpected weight change.  HENT: Negative.   Eyes: Negative.   Respiratory: Negative for cough, chest tightness and shortness of breath.   Cardiovascular: Negative for chest pain, palpitations and leg swelling.  Gastrointestinal: Negative for nausea, abdominal pain, diarrhea, constipation and abdominal distention.  Musculoskeletal: Positive for arthralgias. Negative for myalgias, back pain and gait problem.  Skin: Negative.   Neurological: Negative.   Psychiatric/Behavioral: Negative.       Objective:   Physical Exam  Constitutional: She is oriented to person, place, and time. She appears well-developed and well-nourished.  HENT:  Head: Normocephalic and atraumatic.  Eyes: EOM are normal.  Neck: Normal range of motion.  Cardiovascular: Normal rate and regular rhythm.   Pulmonary/Chest: Effort normal and breath sounds normal. No respiratory distress. She has no wheezes. She has no rales.  Abdominal: Soft. Bowel sounds are normal. She exhibits no distension. There is no tenderness. There is no rebound.  Musculoskeletal: She exhibits no edema.  Neurological: She is alert and oriented to person, place, and time. Coordination normal.  Skin: Skin is warm and dry.  Psychiatric: She has a normal mood and affect.   Filed Vitals:   07/09/15 0911  BP: 118/62  Pulse: 66  Temp: 98.6 F (37 C)  TempSrc: Oral  Resp: 14  Height: 5\' 4"  (1.626 m)  Weight: 144 lb 1.9 oz (65.372 kg)  SpO2: 98%      Assessment & Plan:

## 2015-07-09 NOTE — Patient Instructions (Addendum)
Go back to sneakers for the next 1-2 weeks to help the foot rest and heal. It is okay to use ibuprofen for pain if needed.   Work on doing some of the stretches below 1-2 times a day for the first 2 weeks then a couple times per week after that to help avoid re-injury.   We are checking the blood work today and will call back with results (even if everything is normal).  Plantar Fasciitis With Rehab The plantar fascia is a fibrous, ligament-like, soft-tissue structure that spans the bottom of the foot. Plantar fasciitis, also called heel spur syndrome, is a condition that causes pain in the foot due to inflammation of the tissue. SYMPTOMS   Pain and tenderness on the underneath side of the foot.  Pain that worsens with standing or walking. CAUSES  Plantar fasciitis is caused by irritation and injury to the plantar fascia on the underneath side of the foot. Common mechanisms of injury include:  Direct trauma to bottom of the foot.  Damage to a small nerve that runs under the foot where the main fascia attaches to the heel bone.  Stress placed on the plantar fascia due to bone spurs. RISK INCREASES WITH:   Activities that place stress on the plantar fascia (running, jumping, pivoting, or cutting).  Poor strength and flexibility.  Improperly fitted shoes.  Tight calf muscles.  Flat feet.  Failure to warm-up properly before activity.  Obesity. PREVENTION  Warm up and stretch properly before activity.  Allow for adequate recovery between workouts.  Maintain physical fitness:  Strength, flexibility, and endurance.  Cardiovascular fitness.  Maintain a health body weight.  Avoid stress on the plantar fascia.  Wear properly fitted shoes, including arch supports for individuals who have flat feet. PROGNOSIS  If treated properly, then the symptoms of plantar fasciitis usually resolve without surgery. However, occasionally surgery is necessary. RELATED COMPLICATIONS    Recurrent symptoms that may result in a chronic condition.  Problems of the lower back that are caused by compensating for the injury, such as limping.  Pain or weakness of the foot during push-off following surgery.  Chronic inflammation, scarring, and partial or complete fascia tear, occurring more often from repeated injections. TREATMENT  Treatment initially involves the use of ice and medication to help reduce pain and inflammation. The use of strengthening and stretching exercises may help reduce pain with activity, especially stretches of the Achilles tendon. These exercises may be performed at home or with a therapist. Your caregiver may recommend that you use heel cups of arch supports to help reduce stress on the plantar fascia. Occasionally, corticosteroid injections are given to reduce inflammation. If symptoms persist for greater than 6 months despite non-surgical (conservative), then surgery may be recommended.  MEDICATION   If pain medication is necessary, then nonsteroidal anti-inflammatory medications, such as aspirin and ibuprofen, or other minor pain relievers, such as acetaminophen, are often recommended.  Do not take pain medication within 7 days before surgery.  Prescription pain relievers may be given if deemed necessary by your caregiver. Use only as directed and only as much as you need.  Corticosteroid injections may be given by your caregiver. These injections should be reserved for the most serious cases, because they may only be given a certain number of times. HEAT AND COLD  Cold treatment (icing) relieves pain and reduces inflammation. Cold treatment should be applied for 10 to 15 minutes every 2 to 3 hours for inflammation and pain and immediately  after any activity that aggravates your symptoms. Use ice packs or massage the area with a piece of ice (ice massage).  Heat treatment may be used prior to performing the stretching and strengthening activities  prescribed by your caregiver, physical therapist, or athletic trainer. Use a heat pack or soak the injury in warm water. SEEK IMMEDIATE MEDICAL CARE IF:  Treatment seems to offer no benefit, or the condition worsens.  Any medications produce adverse side effects. EXERCISES RANGE OF MOTION (ROM) AND STRETCHING EXERCISES - Plantar Fasciitis (Heel Spur Syndrome) These exercises may help you when beginning to rehabilitate your injury. Your symptoms may resolve with or without further involvement from your physician, physical therapist or athletic trainer. While completing these exercises, remember:   Restoring tissue flexibility helps normal motion to return to the joints. This allows healthier, less painful movement and activity.  An effective stretch should be held for at least 30 seconds.  A stretch should never be painful. You should only feel a gentle lengthening or release in the stretched tissue. RANGE OF MOTION - Toe Extension, Flexion  Sit with your right / left leg crossed over your opposite knee.  Grasp your toes and gently pull them back toward the top of your foot. You should feel a stretch on the bottom of your toes and/or foot.  Hold this stretch for __________ seconds.  Now, gently pull your toes toward the bottom of your foot. You should feel a stretch on the top of your toes and or foot.  Hold this stretch for __________ seconds. Repeat __________ times. Complete this stretch __________ times per day.  RANGE OF MOTION - Ankle Dorsiflexion, Active Assisted  Remove shoes and sit on a chair that is preferably not on a carpeted surface.  Place right / left foot under knee. Extend your opposite leg for support.  Keeping your heel down, slide your right / left foot back toward the chair until you feel a stretch at your ankle or calf. If you do not feel a stretch, slide your bottom forward to the edge of the chair, while still keeping your heel down.  Hold this stretch for  __________ seconds. Repeat __________ times. Complete this stretch __________ times per day.  STRETCH - Gastroc, Standing  Place hands on wall.  Extend right / left leg, keeping the front knee somewhat bent.  Slightly point your toes inward on your back foot.  Keeping your right / left heel on the floor and your knee straight, shift your weight toward the wall, not allowing your back to arch.  You should feel a gentle stretch in the right / left calf. Hold this position for __________ seconds. Repeat __________ times. Complete this stretch __________ times per day. STRETCH - S[place]us, Standing  Place hands on wall.  Extend right / left leg, keeping the other knee somewhat bent.  Slightly point your toes inward on your back foot.  Keep your right / left heel on the floor, bend your back knee, and slightly shift your weight over the back leg so that you feel a gentle stretch deep in your back calf.  Hold this position for __________ seconds. Repeat __________ times. Complete this stretch __________ times per day. STRETCH - Gastrocsoleus, Standing  Note: This exercise can place a lot of stress on your foot and ankle. Please complete this exercise only if specifically instructed by your caregiver.   Place the ball of your right / left foot on a step, keeping your other foot  firmly on the same step.  Hold on to the wall or a rail for balance.  Slowly lift your other foot, allowing your body weight to press your heel down over the edge of the step.  You should feel a stretch in your right / left calf.  Hold this position for __________ seconds.  Repeat this exercise with a slight bend in your right / left knee. Repeat __________ times. Complete this stretch __________ times per day.  STRENGTHENING EXERCISES - Plantar Fasciitis (Heel Spur Syndrome)  These exercises may help you when beginning to rehabilitate your injury. They may resolve your symptoms with or without further  involvement from your physician, physical therapist or athletic trainer. While completing these exercises, remember:   Muscles can gain both the endurance and the strength needed for everyday activities through controlled exercises.  Complete these exercises as instructed by your physician, physical therapist or athletic trainer. Progress the resistance and repetitions only as guided. STRENGTH - Towel Curls  Sit in a chair positioned on a non-carpeted surface.  Place your foot on a towel, keeping your heel on the floor.  Pull the towel toward your heel by only curling your toes. Keep your heel on the floor.  If instructed by your physician, physical therapist or athletic trainer, add ____________________ at the end of the towel. Repeat __________ times. Complete this exercise __________ times per day. STRENGTH - Ankle Inversion  Secure one end of a rubber exercise band/tubing to a fixed object (table, pole). Loop the other end around your foot just before your toes.  Place your fists between your knees. This will focus your strengthening at your ankle.  Slowly, pull your big toe up and in, making sure the band/tubing is positioned to resist the entire motion.  Hold this position for __________ seconds.  Have your muscles resist the band/tubing as it slowly pulls your foot back to the starting position. Repeat __________ times. Complete this exercises __________ times per day.    This information is not intended to replace advice given to you by your health care provider. Make sure you discuss any questions you have with your health care provider.   Document Released: 01/26/2005 Document Revised: 06/12/2014 Document Reviewed: 05/10/2008 Elsevier Interactive Patient Education 2016 [place] Maintenance, Female Adopting a healthy lifestyle and getting preventive care can go a long way to promote health and wellness. Talk with your health care provider about what schedule  of regular examinations is right for you. This is a good chance for you to check in with your provider about disease prevention and staying healthy. In between checkups, there are plenty of things you can do on your own. Experts have done a lot of research about which lifestyle changes and preventive measures are most likely to keep you healthy. Ask your health care provider for more information. WEIGHT AND DIET  Eat a healthy diet  Be sure to include plenty of vegetables, fruits, low-fat dairy products, and lean protein.  Do not eat a lot of foods high in solid fats, added sugars, or salt.  Get regular exercise. This is one of the most important things you can do for your health.  Most adults should exercise for at least 150 minutes each week. The exercise should increase your heart rate and make you sweat (moderate-intensity exercise).  Most adults should also do strengthening exercises at least twice a week. This is in addition to the moderate-intensity exercise.  Maintain a healthy weight  Body  mass index (BMI) is a measurement that can be used to identify possible weight problems. It estimates body fat based on height and weight. Your health care provider can help determine your BMI and help you achieve or maintain a healthy weight.  For females 66 years of age and older:   A BMI below 18.5 is considered underweight.  A BMI of 18.5 to 24.9 is normal.  A BMI of 25 to 29.9 is considered overweight.  A BMI of 30 and above is considered obese.  Watch levels of cholesterol and blood lipids  You should start having your blood tested for lipids and cholesterol at 39 years of age, then have this test every 5 years.  You may need to have your cholesterol levels checked more often if:  Your lipid or cholesterol levels are high.  You are older than 39 years of age.  You are at high risk for heart disease.  CANCER SCREENING   Lung Cancer  Lung cancer screening is recommended  for adults 50-79 years old who are at high risk for lung cancer because of a history of smoking.  A yearly low-dose CT scan of the lungs is recommended for people who:  Currently smoke.  Have quit within the past 15 years.  Have at least a 30-pack-year history of smoking. A pack year is smoking an average of one pack of cigarettes a day for 1 year.  Yearly screening should continue until it has been 15 years since you quit.  Yearly screening should stop if you develop a health problem that would prevent you from having lung cancer treatment.  Breast Cancer  Practice breast self-awareness. This means understanding how your breasts normally appear and feel.  It also means doing regular breast self-exams. Let your health care provider know about any changes, no matter how small.  If you are in your 20s or 30s, you should have a clinical breast exam (CBE) by a health care provider every 1-3 years as part of a regular health exam.  If you are 70 or older, have a CBE every year. Also consider having a breast X-ray (mammogram) every year.  If you have a family history of breast cancer, talk to your health care provider about genetic screening.  If you are at high risk for breast cancer, talk to your health care provider about having an MRI and a mammogram every year.  Breast cancer gene (BRCA) assessment is recommended for women who have family members with BRCA-related cancers. BRCA-related cancers include:  Breast.  Ovarian.  Tubal.  Peritoneal cancers.  Results of the assessment will determine the need for genetic counseling and BRCA1 and BRCA2 testing. Cervical Cancer Your health care provider may recommend that you be screened regularly for cancer of the pelvic organs (ovaries, uterus, and vagina). This screening involves a pelvic examination, including checking for microscopic changes to the surface of your cervix (Pap test). You may be encouraged to have this screening done  every 3 years, beginning at age 78.  For women ages 63-65, health care providers may recommend pelvic exams and Pap testing every 3 years, or they may recommend the Pap and pelvic exam, combined with testing for human papilloma virus (HPV), every 5 years. Some types of HPV increase your risk of cervical cancer. Testing for HPV may also be done on women of any age with unclear Pap test results.  Other health care providers may not recommend any screening for nonpregnant women who are considered low  risk for pelvic cancer and who do not have symptoms. Ask your health care provider if a screening pelvic exam is right for you.  If you have had past treatment for cervical cancer or a condition that could lead to cancer, you need Pap tests and screening for cancer for at least 20 years after your treatment. If Pap tests have been discontinued, your risk factors (such as having a new sexual partner) need to be reassessed to determine if screening should resume. Some women have medical problems that increase the chance of getting cervical cancer. In these cases, your health care provider may recommend more frequent screening and Pap tests. Colorectal Cancer  This type of cancer can be detected and often prevented.  Routine colorectal cancer screening usually begins at 39 years of age and continues through 39 years of age.  Your health care provider may recommend screening at an earlier age if you have risk factors for colon cancer.  Your health care provider may also recommend using home test kits to check for hidden blood in the stool.  A small camera at the end of a tube can be used to examine your colon directly (sigmoidoscopy or colonoscopy). This is done to check for the earliest forms of colorectal cancer.  Routine screening usually begins at age 44.  Direct examination of the colon should be repeated every 5-10 years through 39 years of age. However, you may need to be screened more often if  early forms of precancerous polyps or small growths are found. Skin Cancer  Check your skin from head to toe regularly.  Tell your health care provider about any new moles or changes in moles, especially if there is a change in a mole's shape or color.  Also tell your health care provider if you have a mole that is larger than the size of a pencil eraser.  Always use sunscreen. Apply sunscreen liberally and repeatedly throughout the day.  Protect yourself by wearing long sleeves, pants, a wide-brimmed hat, and sunglasses whenever you are outside. HEART DISEASE, DIABETES, AND HIGH BLOOD PRESSURE   High blood pressure causes heart disease and increases the risk of stroke. High blood pressure is more likely to develop in:  People who have blood pressure in the high end of the normal range (130-139/85-89 mm Hg).  People who are overweight or obese.  People who are African American.  If you are 41-16 years of age, have your blood pressure checked every 3-5 years. If you are 14 years of age or older, have your blood pressure checked every year. You should have your blood pressure measured twice--once when you are at a hospital or clinic, and once when you are not at a hospital or clinic. Record the average of the two measurements. To check your blood pressure when you are not at a hospital or clinic, you can use:  An automated blood pressure machine at a pharmacy.  A home blood pressure monitor.  If you are between 8 years and 38 years old, ask your health care provider if you should take aspirin to prevent strokes.  Have regular diabetes screenings. This involves taking a blood sample to check your fasting blood sugar level.  If you are at a normal weight and have a low risk for diabetes, have this test once every three years after 39 years of age.  If you are overweight and have a high risk for diabetes, consider being tested at a younger age or more often.  PREVENTING INFECTION    Hepatitis B  If you have a higher risk for hepatitis B, you should be screened for this virus. You are considered at high risk for hepatitis B if:  You were born in a country where hepatitis B is common. Ask your health care provider which countries are considered high risk.  Your parents were born in a high-risk country, and you have not been immunized against hepatitis B (hepatitis B vaccine).  You have HIV or AIDS.  You use needles to inject street drugs.  You live with someone who has hepatitis B.  You have had sex with someone who has hepatitis B.  You get hemodialysis treatment.  You take certain medicines for conditions, including cancer, organ transplantation, and autoimmune conditions. Hepatitis C  Blood testing is recommended for:  Everyone born from [birth year] through 1964.  Anyone with known risk factors for hepatitis C. Sexually transmitted infections (STIs)  You should be screened for sexually transmitted infections (STIs) including gonorrhea and chlamydia if:  You are sexually active and are younger than 39 years of age.  You are older than 39 years of age and your health care provider tells you that you are at risk for this type of infection.  Your sexual activity has changed since you were last screened and you are at an increased risk for chlamydia or gonorrhea. Ask your health care provider if you are at risk.  If you do not have HIV, but are at risk, it may be recommended that you take a prescription medicine daily to prevent HIV infection. This is called pre-exposure prophylaxis (PrEP). You are considered at risk if:  You are sexually active and do not regularly use condoms or know the HIV status of your partner(s).  You take drugs by injection.  You are sexually active with a partner who has HIV. Talk with your health care provider about whether you are at high risk of being infected with HIV. If you choose to begin PrEP, you should first be tested for  HIV. You should then be tested every 3 months for as long as you are taking PrEP.  PREGNANCY   If you are premenopausal and you may become pregnant, ask your health care provider about preconception counseling.  If you may become pregnant, take 400 to 800 micrograms (mcg) of folic acid every day.  If you want to prevent pregnancy, talk to your health care provider about birth control (contraception). OSTEOPOROSIS AND MENOPAUSE   Osteoporosis is a disease in which the bones lose minerals and strength with aging. This can result in serious bone fractures. Your risk for osteoporosis can be identified using a bone density scan.  If you are 56 years of age or older, or if you are at risk for osteoporosis and fractures, ask your health care provider if you should be screened.  Ask your health care provider whether you should take a calcium or vitamin D supplement to lower your risk for osteoporosis.  Menopause may have certain physical symptoms and risks.  Hormone replacement therapy may reduce some of these symptoms and risks. Talk to your health care provider about whether hormone replacement therapy is right for you.  HOME CARE INSTRUCTIONS   Schedule regular health, dental, and eye exams.  Stay current with your immunizations.   Do not use any tobacco products including cigarettes, chewing tobacco, or electronic cigarettes.  If you are pregnant, do not drink alcohol.  If you are breastfeeding, limit how much and  how often you drink alcohol.  Limit alcohol intake to no more than 1 drink per day for nonpregnant women. One drink equals 12 ounces of beer, 5 ounces of wine, or 1 ounces of hard liquor.  Do not use street drugs.  Do not share needles.  Ask your health care provider for help if you need support or information about quitting drugs.  Tell your health care provider if you often feel depressed.  Tell your health care provider if you have ever been abused or do not feel  safe at home.   This information is not intended to replace advice given to you by your health care provider. Make sure you discuss any questions you have with your health care provider.   Document Released: 08/11/2010 Document Revised: 02/16/2014 Document Reviewed: 12/28/2012 Elsevier Interactive Patient Education [employer].

## 2015-07-09 NOTE — Progress Notes (Signed)
Pre visit review using our clinic review tool, if applicable. No additional management support is needed unless otherwise documented below in the visit note. 

## 2015-07-09 NOTE — Assessment & Plan Note (Addendum)
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 Getting records from ob/gyn. No indication for early colonoscopy. Exercising some and non-smoker. Counseled about sun safety and mole surveillance.

## 2016-10-01 LAB — CBC AND DIFFERENTIAL: HEMOGLOBIN: 14.6 (ref 12.0–16.0)

## 2016-10-05 ENCOUNTER — Encounter: Payer: Self-pay | Admitting: Internal Medicine

## 2016-10-05 LAB — CHG URINALYSIS NONAUTO W/O SCOPE
BLOOD: NEGATIVE
Glucose: NEGATIVE
LEUKOCYTES UA: NEGATIVE — AB
Nitrite: NEGATIVE
PROTEIN: NEGATIVE

## 2016-10-05 NOTE — Progress Notes (Signed)
Abstracted and sent to scan  

## 2017-10-13 ENCOUNTER — Telehealth: Payer: BLUE CROSS/BLUE SHIELD | Admitting: Nurse Practitioner

## 2017-10-13 DIAGNOSIS — R059 Cough, unspecified: Secondary | ICD-10-CM

## 2017-10-13 DIAGNOSIS — R05 Cough: Secondary | ICD-10-CM

## 2017-10-13 MED ORDER — BENZONATATE 100 MG PO CAPS: 100.0000 mg | ORAL_CAPSULE | Freq: Three times a day (TID) | ORAL | 0 refills | Status: DC | PRN

## 2017-10-13 MED ORDER — AZITHROMYCIN 250 MG PO TABS: ORAL_TABLET | ORAL | 0 refills | Status: DC

## 2017-10-13 NOTE — Progress Notes (Signed)
We are sorry that you are not feeling well.  Here is how we plan to help!  Based on your presentation I believe you most likely have A cough due to bacteria.  When patients have a fever and a productive cough with a change in color or increased sputum production, we are concerned about bacterial bronchitis.  If left untreated it can progress to pneumonia.  If your symptoms do not improve with your treatment plan it is important that you contact your provider.   I have prescribed Azithromyin 250 mg: two tablets now and then one tablet daily for 4 additonal days    In addition you may use A prescription cough medication called Tessalon Perles 100mg . You may take 1-2 capsules every 8 hours as needed for your cough.  * cannot do codeine cough meds or albuterol in an e visit  From your responses in the eVisit questionnaire you describe inflammation in the upper respiratory tract which is causing a significant cough.  This is commonly called Bronchitis and has four common causes:    Allergies  Viral Infections  Acid Reflux  Bacterial Infection Allergies, viruses and acid reflux are treated by controlling symptoms or eliminating the cause. An example might be a cough caused by taking certain blood pressure medications. You stop the cough by changing the medication. Another example might be a cough caused by acid reflux. Controlling the reflux helps control the cough.  USE OF BRONCHODILATOR ("RESCUE") INHALERS: There is a risk from using your bronchodilator too frequently.  The risk is that over-reliance on a medication which only relaxes the muscles surrounding the breathing tubes can reduce the effectiveness of medications prescribed to reduce swelling and congestion of the tubes themselves.  Although you feel brief relief from the bronchodilator inhaler, your asthma may actually be worsening with the tubes becoming more swollen and filled with mucus.  This can delay other crucial treatments, such as  oral steroid medications. If you need to use a bronchodilator inhaler daily, several times per day, you should discuss this with your provider.  There are probably better treatments that could be used to keep your asthma under control.     HOME CARE . Only take medications as instructed by your medical team. . Complete the entire course of an antibiotic. . Drink plenty of fluids and get plenty of rest. . Avoid close contacts especially the very young and the elderly . Cover your mouth if you cough or cough into your sleeve. . Always remember to wash your hands . A steam or ultrasonic humidifier can help congestion.   GET HELP RIGHT AWAY IF: . You develop worsening fever. . You become short of breath . You cough up blood. . Your symptoms persist after you have completed your treatment plan MAKE SURE YOU   Understand these instructions.  Will watch your condition.  Will get help right away if you are not doing well or get worse.  Your e-visit answers were reviewed by a board certified advanced clinical practitioner to complete your personal care plan.  Depending on the condition, your plan could have included both over the counter or prescription medications. If there is a problem please reply  once you have received a response from your provider. Your safety is important to [place].  If you have drug allergies check your prescription carefully.    You can use MyChart to ask questions about today's visit, request a non-urgent call back, or ask for a work or school  excuse for 24 hours related to this e-Visit. If it has been greater than 24 hours you will need to follow up with your provider, or enter a new e-Visit to address those concerns. You will get an e-mail in the next two days asking about your experience.  I hope that your e-visit has been valuable and will speed your recovery. Thank you for using e-visits.

## 2017-12-31 LAB — HM PAP SMEAR

## 2017-12-31 LAB — RESULTS CONSOLE HPV: CHL HPV: NEGATIVE

## 2018-01-13 ENCOUNTER — Other Ambulatory Visit: Payer: Self-pay | Admitting: Obstetrics and Gynecology

## 2018-01-13 DIAGNOSIS — R928 Other abnormal and inconclusive findings on diagnostic imaging of breast: Secondary | ICD-10-CM

## 2018-01-17 ENCOUNTER — Other Ambulatory Visit: Payer: BLUE CROSS/BLUE SHIELD

## 2018-01-19 ENCOUNTER — Ambulatory Visit
Admission: RE | Admit: 2018-01-19 | Discharge: 2018-01-19 | Disposition: A | Discharge status: Home / Self Care | Payer: BLUE CROSS/BLUE SHIELD | Source: Ambulatory Visit | Attending: Obstetrics and Gynecology | Admitting: Obstetrics and Gynecology

## 2018-01-19 ENCOUNTER — Other Ambulatory Visit: Payer: Self-pay | Admitting: Obstetrics and Gynecology

## 2018-01-19 DIAGNOSIS — N6489 Other specified disorders of breast: Secondary | ICD-10-CM

## 2018-01-19 DIAGNOSIS — R928 Other abnormal and inconclusive findings on diagnostic imaging of breast: Secondary | ICD-10-CM

## 2018-01-24 ENCOUNTER — Other Ambulatory Visit: Payer: Self-pay | Admitting: Obstetrics and Gynecology

## 2018-01-24 ENCOUNTER — Ambulatory Visit
Admission: RE | Admit: 2018-01-24 | Discharge: 2018-01-24 | Disposition: A | Discharge status: Home / Self Care | Payer: BLUE CROSS/BLUE SHIELD | Source: Ambulatory Visit | Attending: Obstetrics and Gynecology | Admitting: Obstetrics and Gynecology

## 2018-01-24 DIAGNOSIS — N6489 Other specified disorders of breast: Secondary | ICD-10-CM

## 2018-08-09 ENCOUNTER — Ambulatory Visit
Admission: RE | Admit: 2018-08-09 | Discharge: 2018-08-09 | Disposition: A | Discharge status: Home / Self Care | Payer: BC Managed Care – PPO | Source: Ambulatory Visit | Attending: Obstetrics and Gynecology | Admitting: Obstetrics and Gynecology

## 2018-08-09 ENCOUNTER — Other Ambulatory Visit: Payer: Self-pay | Admitting: Obstetrics and Gynecology

## 2018-08-09 ENCOUNTER — Other Ambulatory Visit: Payer: Self-pay

## 2018-08-09 DIAGNOSIS — R922 Inconclusive mammogram: Secondary | ICD-10-CM | POA: Diagnosis not present

## 2018-08-09 DIAGNOSIS — N6489 Other specified disorders of breast: Secondary | ICD-10-CM

## 2019-01-16 ENCOUNTER — Other Ambulatory Visit: Payer: Self-pay

## 2019-01-16 DIAGNOSIS — Z20822 Contact with and (suspected) exposure to covid-19: Secondary | ICD-10-CM

## 2019-01-17 LAB — NOVEL CORONAVIRUS, NAA: SARS-CoV-2, NAA: NOT DETECTED

## 2019-01-30 ENCOUNTER — Ambulatory Visit
Admission: RE | Admit: 2019-01-30 | Discharge: 2019-01-30 | Disposition: A | Discharge status: Home / Self Care | Payer: BC Managed Care – PPO | Source: Ambulatory Visit | Attending: Obstetrics and Gynecology | Admitting: Obstetrics and Gynecology

## 2019-01-30 ENCOUNTER — Other Ambulatory Visit: Payer: Self-pay

## 2019-01-30 DIAGNOSIS — N6489 Other specified disorders of breast: Secondary | ICD-10-CM

## 2019-01-30 DIAGNOSIS — R922 Inconclusive mammogram: Secondary | ICD-10-CM | POA: Diagnosis not present

## 2019-01-30 LAB — HM MAMMOGRAPHY

## 2019-04-01 ENCOUNTER — Ambulatory Visit: Payer: BC Managed Care – PPO | Attending: Internal Medicine

## 2019-04-01 DIAGNOSIS — Z23 Encounter for immunization: Secondary | ICD-10-CM | POA: Insufficient documentation

## 2019-04-01 NOTE — Progress Notes (Signed)
   Covid-19 Vaccination Clinic  Name:  Crystal Mitchell    MRN: [id number] DOB: 1975/03/31  04/01/2019  [name] was observed post Covid-19 immunization for 15 minutes without incidence. She was provided with Vaccine Information Sheet and instruction to access the V-Safe system.   [name] was instructed to call 911 with any severe reactions post vaccine: Marland Kitchen Difficulty breathing  . Swelling of your face and throat  . A fast heartbeat  . A bad rash all over your body  . Dizziness and weakness    Immunizations Administered    Name Date Dose VIS Date Route   Pfizer COVID-19 Vaccine 04/01/2019  1:45 PM 0.3 mL 01/20/2019 Intramuscular   Manufacturer: [employer], [employer]   Lot: [id number]   NDC: [address]-4

## 2019-04-24 ENCOUNTER — Ambulatory Visit: Payer: BC Managed Care – PPO | Attending: Internal Medicine

## 2019-04-24 DIAGNOSIS — Z23 Encounter for immunization: Secondary | ICD-10-CM

## 2019-04-24 NOTE — Progress Notes (Signed)
   Covid-19 Vaccination Clinic  Name:  Crystal Mitchell    MRN: [id number] DOB: Oct 15, 1975  04/24/2019  [name] was observed post Covid-19 immunization for 15 minutes without incident. She was provided with Vaccine Information Sheet and instruction to access the V-Safe system.   [name] was instructed to call 911 with any severe reactions post vaccine: Marland Kitchen Difficulty breathing  . Swelling of face and throat  . A fast heartbeat  . A bad rash all over body  . Dizziness and weakness   Immunizations Administered    Name Date Dose VIS Date Route   Pfizer COVID-19 Vaccine 04/24/2019  1:56 PM 0.3 mL 01/20/2019 Intramuscular   Manufacturer: [employer], [employer]   Lot: [id number]   NDC: [address]-7

## 2019-10-30 DIAGNOSIS — Z03818 Encounter for observation for suspected exposure to other biological agents ruled out: Secondary | ICD-10-CM | POA: Diagnosis not present

## 2019-11-10 DIAGNOSIS — Z03818 Encounter for observation for suspected exposure to other biological agents ruled out: Secondary | ICD-10-CM | POA: Diagnosis not present

## 2019-11-29 DIAGNOSIS — Z0000 Encounter for general adult medical examination without abnormal findings: Secondary | ICD-10-CM | POA: Diagnosis not present

## 2019-11-29 DIAGNOSIS — E7800 Pure hypercholesterolemia, unspecified: Secondary | ICD-10-CM | POA: Diagnosis not present

## 2019-11-29 DIAGNOSIS — K219 Gastro-esophageal reflux disease without esophagitis: Secondary | ICD-10-CM | POA: Diagnosis not present

## 2019-11-29 DIAGNOSIS — F419 Anxiety disorder, unspecified: Secondary | ICD-10-CM | POA: Diagnosis not present

## 2020-09-13 LAB — BASIC METABOLIC PANEL
BUN: 14 (ref 4–21)
Chloride: 103 (ref 99–108)
Creatinine: 0.8 (ref 0.5–1.1)
Glucose: 79
Potassium: 4.7 (ref 3.4–5.3)
Sodium: 138 (ref 137–147)

## 2020-09-13 LAB — COMPREHENSIVE METABOLIC PANEL
Albumin: 4.4 (ref 3.5–5.0)
Calcium: 9.8 (ref 8.7–10.7)
GFR calc non Af Amer: 94
Globulin: 2.2

## 2020-09-13 LAB — HEMOGLOBIN A1C: Hemoglobin A1C: 5.1

## 2020-09-13 LAB — LIPID PANEL
Cholesterol: 242 — AB (ref 0–200)
HDL: 78 — AB (ref 35–70)
LDL Cholesterol: 145
Triglycerides: 82 (ref 40–160)

## 2020-09-13 LAB — HEPATIC FUNCTION PANEL
ALT: 14 (ref 7–35)
AST: 15 (ref 13–35)
Alkaline Phosphatase: 30 (ref 25–125)
Bilirubin, Direct: 0.1 (ref 0.01–0.4)
Bilirubin, Total: 0.6

## 2020-09-13 LAB — IRON,TIBC AND FERRITIN PANEL: Iron: 103

## 2020-09-13 LAB — CBC AND DIFFERENTIAL
HCT: 41 (ref 36–46)
Hemoglobin: 13.6 (ref 12.0–16.0)
Platelets: 311 (ref 150–399)
WBC: 6.4

## 2020-09-13 LAB — CBC: RBC: 4.23 (ref 3.87–5.11)

## 2020-09-13 LAB — TSH: TSH: 2.38 (ref 0.41–5.90)

## 2021-01-06 NOTE — Progress Notes (Signed)
Crystal Mitchell is a 45 y.o. female here to establish care.  History of Present Illness:   Chief Complaint  Patient presents with   Establish Care   Chest Pain    Pt c/o chest pain,pressure and radiates to both arms mainly the right off and on since August.   Nutrition Counseling    Pt would like to discuss weight loss options.   Chest pain Started in August. Pressure in chest and pain in R arm. Happened twice within a 24 hour period back in August. Lasted for several minutes. Starts in R arm and radiates to chest. Not a severe, sharp pain, but more dull. Has been increasing her activity recently -- did 5k earlier this month and was able to run/walk without any chest discomfort.  Denies: n/v, diarrhea, SOB, palpitations, HA  HLD Hx of this since 45 yo. Has lipid panel for [place] today to review: Total cholesterol 242 HLD 78 LDL 145 Has never been on statin. Family of MI and heart disease on paternal side.  Anxiety; Overweight Has extremely demanding job and 3 children, married. Has been working on better self care habits and is hoping to improve lifestyle and mood with lifestyle changes and talk therapy. Denies SI/HI. Was recommended to start lexapro in the past by her prior PCP but did not take this, wanted to work on this with lifestyle changes instead. Would like to see an RD. Has cut back on drinking since pandemic but is still trying to get down to 145 lb weight.  Body mass index is 27.32 kg/m.   Depression screen [facility] 2/9 01/07/2021  Decreased Interest 0  Down, Depressed, Hopeless 0  PHQ - 2 Score 0    No flowsheet data found.   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    Had baby at 31 weeks 5 days   Hyperlipidemia    Seasonal allergies      Social History   Tobacco Use   Smoking status: Former    Packs/day: 0.25    Years: 7.00    Pack years: 1.75    Types: Cigarettes    Start date: 1995    Quit date: 2002    Years since quitting: 20.9   Smokeless tobacco: Never  Vaping Use   Vaping Use: Never used  Substance Use Topics   Alcohol use: Yes    Alcohol/week: 6.0 - 7.0 standard drinks    Types: 6 - 7 Glasses of wine per week   Drug use: Not Currently    Types: Marijuana    Comment: In college    Past Surgical History:  Procedure Laterality Date   CESAREAN SECTION  08/29/2011   Procedure: CESAREAN SECTION;  Surgeon: Sherron Monday, MD;  Location: [facility];  Service: Gynecology;  Laterality: N/A;  Primary cesarean section with delivery of baby girl at 29. Apgars 3/8.    Family History  Problem Relation Age of Onset   Osteoarthritis Mother    Asthma Mother    Heart disease Father    Hypertension Maternal Grandmother    Heart disease Maternal Grandfather    Heart disease Paternal Grandmother    Heart disease Paternal Grandfather    Breast  cancer Maternal Aunt    Other Neg Hx     Allergies  Allergen Reactions   Pollen Extract-Tree Extract [Pollen Extract] Itching    Itchy eyes, sneezing   Amoxicillin Nausea And Vomiting     Current Medications:   Current Outpatient Medications:    cetirizine (ZYRTEC) 10 MG tablet, 1 tablet, Disp: , Rfl:    ibuprofen (ADVIL,MOTRIN) 200 MG tablet, Take 200 mg by mouth every 6 (six) hours as needed., Disp: , Rfl:    valACYclovir (VALTREX) 500 MG tablet, valacyclovir 500 mg tablet  TAKE 1 TABLET BY MOUTH EVERY DAY AND TWICE A DAY WITH OUTBREAKS, Disp: , Rfl:    Review of Systems:   ROS Negative unless otherwise specified per HPI.  Vitals:   Vitals:   01/07/21 0840  BP: 118/80  Pulse: 60  Temp: 98.1 F (36.7 C)  TempSrc: Temporal  SpO2: 98%  Weight: 160 lb 6.1 oz (72.7 kg)  Height: 5'  4.25" (1.632 m)      Body mass index is 27.32 kg/m.  Physical Exam:   Physical Exam Vitals and nursing note reviewed.  Constitutional:      General: She is not in acute distress.    Appearance: She is well-developed. She is not ill-appearing or toxic-appearing.  Cardiovascular:     Rate and Rhythm: Normal rate and regular rhythm.     Pulses: Normal pulses.     Heart sounds: Normal heart sounds, S1 normal and S2 normal.  Pulmonary:     Effort: Pulmonary effort is normal.     Breath sounds: Normal breath sounds.  Skin:    General: Skin is warm and dry.  Neurological:     Mental Status: She is alert.     GCS: GCS eye subscore is 4. GCS verbal subscore is 5. GCS motor subscore is 6.  Psychiatric:        Speech: Speech normal.        Behavior: Behavior normal. Behavior is cooperative.    Assessment and Plan:   Chest pain, unspecified type EKG tracing is personally reviewed.  EKG notes NSR.  No acute changes.  Continue to monitor symptoms, low suspicion for ACS at this time -- no exertional component Recent blood work reviewed Will obtain calcium score for further evaluation Continue to work on healthy lifestyle  Overweight RD referral placed  Anxiety Uncontrolled Denies need for medication at this time Therapy recommendations given Denies SI/HI  Hypercholesterolemia ASCVD is 0.5% -- reviewed this with her We are obtaining calcium score CT -- she is agreeable Further advice based on this test  Follow-up in 3 months, sooner if concerns  Jarold Motto, PA-C

## 2021-01-07 ENCOUNTER — Ambulatory Visit: Payer: BC Managed Care – PPO | Admitting: Physician Assistant

## 2021-01-07 ENCOUNTER — Other Ambulatory Visit: Payer: Self-pay

## 2021-01-07 ENCOUNTER — Encounter: Payer: Self-pay | Admitting: Physician Assistant

## 2021-01-07 VITALS — BP 118/80 | HR 60 | Temp 98.1°F | Ht 64.25 in | Wt 160.4 lb

## 2021-01-07 DIAGNOSIS — F419 Anxiety disorder, unspecified: Secondary | ICD-10-CM

## 2021-01-07 DIAGNOSIS — K219 Gastro-esophageal reflux disease without esophagitis: Secondary | ICD-10-CM | POA: Insufficient documentation

## 2021-01-07 DIAGNOSIS — E663 Overweight: Secondary | ICD-10-CM | POA: Diagnosis not present

## 2021-01-07 DIAGNOSIS — R079 Chest pain, unspecified: Secondary | ICD-10-CM

## 2021-01-07 DIAGNOSIS — E7800 Pure hypercholesterolemia, unspecified: Secondary | ICD-10-CM

## 2021-01-07 NOTE — Patient Instructions (Signed)
It was great to see you!  Referral to dietitian  Therapy recommendations: Spero Geralds: [email or web address] Carollee Herter Englehorn: [email or web address]  Calcium score CT scan has been ordered, someone will call you for this  Let's follow-up in 3 months to check in on lifestyle changes and your mental health, sooner if you have concerns.  If a referral was placed today, you will be contacted for an appointment. Please note that routine referrals can sometimes take up to 3-4 weeks to process. Please call our office if you haven't heard anything after this time frame.  Take care,  Jarold Motto PA-C

## 2021-01-09 ENCOUNTER — Encounter: Payer: Self-pay | Admitting: Physician Assistant

## 2021-01-28 ENCOUNTER — Ambulatory Visit (INDEPENDENT_AMBULATORY_CARE_PROVIDER_SITE_OTHER)
Admission: RE | Admit: 2021-01-28 | Discharge: 2021-01-28 | Disposition: A | Discharge status: Home / Self Care | Payer: Self-pay | Source: Ambulatory Visit | Attending: Physician Assistant | Admitting: Physician Assistant

## 2021-01-28 ENCOUNTER — Other Ambulatory Visit: Payer: Self-pay

## 2021-01-28 DIAGNOSIS — E7800 Pure hypercholesterolemia, unspecified: Secondary | ICD-10-CM

## 2021-01-30 ENCOUNTER — Encounter: Payer: Self-pay | Admitting: Physician Assistant

## 2021-02-03 ENCOUNTER — Other Ambulatory Visit: Payer: Self-pay | Admitting: Physician Assistant

## 2021-02-03 DIAGNOSIS — K769 Liver disease, unspecified: Secondary | ICD-10-CM

## 2021-02-12 NOTE — Progress Notes (Signed)
After several attempts to reach the patient by phone a letter was mailed out today for the patient to call the office.  Esiquio Boesen,cma

## 2021-02-13 ENCOUNTER — Other Ambulatory Visit: Payer: Self-pay | Admitting: Obstetrics and Gynecology

## 2021-02-13 DIAGNOSIS — N6489 Other specified disorders of breast: Secondary | ICD-10-CM

## 2021-02-13 DIAGNOSIS — Z09 Encounter for follow-up examination after completed treatment for conditions other than malignant neoplasm: Secondary | ICD-10-CM

## 2021-02-19 ENCOUNTER — Encounter: Payer: Self-pay | Admitting: Physician Assistant

## 2021-02-19 ENCOUNTER — Ambulatory Visit
Admission: RE | Admit: 2021-02-19 | Discharge: 2021-02-19 | Disposition: A | Discharge status: Home / Self Care | Payer: BC Managed Care – PPO | Source: Ambulatory Visit | Attending: Obstetrics and Gynecology | Admitting: Obstetrics and Gynecology

## 2021-02-19 DIAGNOSIS — N6489 Other specified disorders of breast: Secondary | ICD-10-CM

## 2021-02-19 DIAGNOSIS — R922 Inconclusive mammogram: Secondary | ICD-10-CM | POA: Diagnosis not present

## 2021-02-21 ENCOUNTER — Ambulatory Visit
Admission: RE | Admit: 2021-02-21 | Discharge: 2021-02-21 | Disposition: A | Discharge status: Home / Self Care | Payer: BC Managed Care – PPO | Source: Ambulatory Visit | Attending: Physician Assistant | Admitting: Physician Assistant

## 2021-02-21 DIAGNOSIS — K769 Liver disease, unspecified: Secondary | ICD-10-CM

## 2021-02-21 DIAGNOSIS — K824 Cholesterolosis of gallbladder: Secondary | ICD-10-CM | POA: Diagnosis not present

## 2021-02-21 DIAGNOSIS — K7689 Other specified diseases of liver: Secondary | ICD-10-CM | POA: Diagnosis not present

## 2021-04-08 ENCOUNTER — Other Ambulatory Visit: Payer: Self-pay

## 2021-04-08 ENCOUNTER — Encounter: Payer: Self-pay | Admitting: Physician Assistant

## 2021-04-08 ENCOUNTER — Ambulatory Visit: Payer: BC Managed Care – PPO | Admitting: Physician Assistant

## 2021-04-08 VITALS — BP 110/78 | HR 58 | Temp 97.7°F | Ht 64.25 in | Wt 161.2 lb

## 2021-04-08 DIAGNOSIS — K769 Liver disease, unspecified: Secondary | ICD-10-CM

## 2021-04-08 DIAGNOSIS — K824 Cholesterolosis of gallbladder: Secondary | ICD-10-CM | POA: Insufficient documentation

## 2021-04-08 DIAGNOSIS — F419 Anxiety disorder, unspecified: Secondary | ICD-10-CM

## 2021-04-08 DIAGNOSIS — R079 Chest pain, unspecified: Secondary | ICD-10-CM

## 2021-04-08 NOTE — Patient Instructions (Signed)
It was great to see you!  Therapy recommendations: Spero Geralds: [email or web address] Carollee Herter Englehorn: [email or web address]  Consider making an appointment with yourself for self-care!  Look at different apps to limit social media time!  Take care,  Jarold Motto PA-C

## 2021-04-08 NOTE — Progress Notes (Signed)
Crystal Mitchell is a 46 y.o. female here for a follow up of a pre-existing problem.  History of Present Illness:   Chief Complaint  Patient presents with   Anxiety    Anxiety Crystal Mitchell presents for f/u of anxiety. During our previous visit on 01/07/21, she expressed how she was trying to balance between being a mother of three children and working an extremely demanding job. At that time she didn't want to trial any medication, instead wanting to participate in talk therapy. Currently she states she has not followed up with recommended talk therapist due to just not getting to it as of yet. States she knows this is important to her and she is going to get this done as soon as possible.   In regards to trialing medication, she has been going back and forth on the matter due to being afraid of the possible side effects but also knowing that it does work. When it comes to her job, she finds herself experiencing anxiety when certain tasks are not completed despite it seemingly being impossible to get everything done in one day. At this time she expresses that she believes she can never get away from work.   Hepatic Cyst After completing her CT Calcium score, pt was found to have a 13 mm low-density lesion in the left hepatic lobe, likely cyst. Currently she is not interested in any further intervention if not deemed necessary. Denies concerning sx.   Chest Pain Following our prior visit, pt wanted to complete a coronary calcium score to give her piece of mind that nothing more significant could be causing her chest pain. Result of this scan was 0 and provided Crystal Mitchell with much needed relief. At this time she expresses belief that past chest pain was stress related. Denies ongoing sx.   Past Medical History:  Diagnosis Date   Allergy    Anxiety    Clotting disorder (HCC)    with pregnancy   GERD (gastroesophageal reflux disease)    HELLP (hemolytic anemia/elev liver enzymes/low platelets in  pregnancy) 08/31/2011   Had baby at 31 weeks 5 days   Hyperlipidemia    Seasonal allergies      Social History   Tobacco Use   Smoking status: Former    Packs/day: 0.25    Years: 7.00    Pack years: 1.75    Types: Cigarettes    Start date: 95    Quit date: 2002    Years since quitting: 21.1   Smokeless tobacco: Never  Vaping Use   Vaping Use: Never used  Substance Use Topics   Alcohol use: Yes    Alcohol/week: 6.0 - 7.0 standard drinks    Types: 6 - 7 Glasses of wine per week   Drug use: Not Currently    Types: Marijuana    Comment: In college    Past Surgical History:  Procedure Laterality Date   CESAREAN SECTION  08/29/2011   Procedure: CESAREAN SECTION;  Surgeon: Sherron Monday, MD;  Location: [facility];  Service: Gynecology;  Laterality: N/A;  Primary cesarean section with delivery of baby girl at 68. Apgars 3/8.    Family History  Problem Relation Age of Onset   Osteoarthritis Mother    Asthma Mother    Heart disease Father    Hearing loss Father    Birth defects Brother    Early death Brother    Osteoarthritis Maternal Grandmother    Hyperlipidemia Maternal Grandfather    Heart disease Maternal  Grandfather    Heart attack Maternal Grandfather    Heart disease Paternal Grandmother    Heart attack Paternal Grandfather    Hyperlipidemia Paternal Grandfather    Heart disease Paternal Grandfather    Breast cancer Maternal Aunt    Asthma Daughter    Learning disabilities Daughter    Other Neg Hx     Allergies  Allergen Reactions   Pollen Extract-Tree Extract [Pollen Extract] Itching    Itchy eyes, sneezing   Amoxicillin Nausea And Vomiting    Current Medications:   Current Outpatient Medications:    cetirizine (ZYRTEC) 10 MG tablet, 1 tablet, Disp: , Rfl:    ibuprofen (ADVIL,MOTRIN) 200 MG tablet, Take 200 mg by mouth every 6 (six) hours as needed., Disp: , Rfl:    valACYclovir (VALTREX) 500 MG tablet, valacyclovir 500 mg tablet  TAKE 1 TABLET BY  MOUTH EVERY DAY AND TWICE A DAY WITH OUTBREAKS, Disp: , Rfl:    Review of Systems:   ROS Negative unless otherwise specified per HPI. Vitals:   Vitals:   04/08/21 0802  BP: 110/78  Pulse: (!) 58  Temp: 97.7 F (36.5 C)  TempSrc: Temporal  SpO2: 99%  Weight: 161 lb 4 oz (73.1 kg)  Height: 5' 4.25" (1.632 m)     Body mass index is 27.46 kg/m.  Physical Exam:   Physical Exam Vitals and nursing note reviewed.  Constitutional:      General: She is not in acute distress.    Appearance: She is well-developed. She is not ill-appearing or toxic-appearing.  Cardiovascular:     Rate and Rhythm: Normal rate and regular rhythm.     Pulses: Normal pulses.     Heart sounds: Normal heart sounds, S1 normal and S2 normal.  Pulmonary:     Effort: Pulmonary effort is normal.     Breath sounds: Normal breath sounds.  Skin:    General: Skin is warm and dry.  Neurological:     Mental Status: She is alert.     GCS: GCS eye subscore is 4. GCS verbal subscore is 5. GCS motor subscore is 6.  Psychiatric:        Speech: Speech normal.        Behavior: Behavior normal. Behavior is cooperative.    Assessment and Plan:   Anxiety Ongoing Encouraged patient to reach out to schedule talk therapy Recommend scheduling appointment for "me time" q 1-2 weeks Recommend looking at different apps to limit social media Consider rx I discussed with patient that if they develop any SI, to tell someone immediately and seek medical attention. Follow-up in 3 months, sooner if concerns  Liver lesion; Gallbladder polyp Asymptomatic Reviewed findings from u/s Suspect benign cyst and gallbladder polyp May consider GI evaluation if/when needed  Chest pain, unspecified type Asymptomatic at this time Reassuring calcium score If new/worsening symptoms, let me know  I,Havlyn C Ratchford,acting as a scribe for [employer], PA.,have documented all relevant documentation on the behalf of Jarold Motto,  PA,as directed by  Jarold Motto, PA while in the presence of Jarold Motto, [place].  I, Jarold Motto, [place], have reviewed all documentation for this visit. The documentation on 04/08/21 for the exam, diagnosis, procedures, and orders are all accurate and complete.   Jarold Motto, PA-C

## 2021-04-25 ENCOUNTER — Telehealth: Payer: BC Managed Care – PPO | Admitting: Nurse Practitioner

## 2021-04-25 DIAGNOSIS — J301 Allergic rhinitis due to pollen: Secondary | ICD-10-CM

## 2021-04-25 DIAGNOSIS — J069 Acute upper respiratory infection, unspecified: Secondary | ICD-10-CM

## 2021-04-25 DIAGNOSIS — R051 Acute cough: Secondary | ICD-10-CM | POA: Diagnosis not present

## 2021-04-26 MED ORDER — FLUTICASONE PROPIONATE 50 MCG/ACT NA SUSP: 2.0000 | Freq: Every day | NASAL | 6 refills | Status: DC

## 2021-04-26 MED ORDER — AZITHROMYCIN 250 MG PO TABS
ORAL_TABLET | ORAL | 0 refills | Status: AC
Start: 2021-04-26 — End: 2021-05-01

## 2021-04-26 NOTE — Progress Notes (Signed)
We are sorry that you are not feeling well.  Here is how we plan to help! ? ?Based on your presentation I believe you most likely have due to your allergies. If you have nt already be sure to start a daily Claritin or Zyrtec. We will also call in nasal spray Flonase to help combat your sinus symptoms. You should continue using both throughout allergy season. For your cough: ?A cough due to bacteria.  When patients have a fever and a productive cough with a change in color or increased sputum production, we are concerned about bacterial bronchitis.  If left untreated it can progress to pneumonia.  If your symptoms do not improve with your treatment plan it is important that you contact your provider.   I have prescribed Azithromyin 250 mg: two tablets now and then one tablet daily for 4 additonal days  ?  ? ?From your responses in the eVisit questionnaire you describe inflammation in the upper respiratory tract which is causing a significant cough.  This is commonly called Bronchitis and has four common causes:   ?Allergies ?Viral Infections ?Acid Reflux ?Bacterial Infection ?Allergies, viruses and acid reflux are treated by controlling symptoms or eliminating the cause. An example might be a cough caused by taking certain blood pressure medications. You stop the cough by changing the medication. Another example might be a cough caused by acid reflux. Controlling the reflux helps control the cough. ? ?   ?HOME CARE ?Only take medications as instructed by your medical team. ?Complete the entire course of an antibiotic. ?Drink plenty of fluids and get plenty of rest. ?Avoid close contacts especially the very young and the elderly ?Cover your mouth if you cough or cough into your sleeve. ?Always remember to wash your hands ?A steam or ultrasonic humidifier can help congestion.  ? ?GET HELP RIGHT AWAY IF: ?You develop worsening fever. ?You become short of breath ?You cough up blood. ?Your symptoms persist after you have  completed your treatment plan ?MAKE SURE YOU  ?Understand these instructions. ?Will watch your condition. ?Will get help right away if you are not doing well or get worse. ?  ? ?Thank you for choosing an e-visit. ? ?Your e-visit answers were reviewed by a board certified advanced clinical practitioner to complete your personal care plan. Depending upon the condition, your plan could have included both over the counter or prescription medications. ? ?Please review your pharmacy choice. Make sure the pharmacy is open so you can pick up prescription now. If there is a problem, you may contact your provider through [employer] and have the prescription routed to another pharmacy.  Your safety is important to [place]. If you have drug allergies check your prescription carefully.  ? ?For the next 24 hours you can use MyChart to ask questions about today's visit, request a non-urgent call back, or ask for a work or school excuse. ?You will get an email in the next two days asking about your experience. I hope that your e-visit has been valuable and will speed your recovery.  ? ?I spent approximately 7 minutes reviewing the patient's history, current symptoms and coordinating their plan of care today.   ? ?Meds ordered this encounter  ?Medications  ? fluticasone (FLONASE) 50 MCG/ACT nasal spray  ?  Sig: Place 2 sprays into both nostrils daily.  ?  Dispense:  16 g  ?  Refill:  6  ? azithromycin (ZITHROMAX) 250 MG tablet  ?  Sig: Take 2 tablets  on day 1, then 1 tablet daily on days 2 through 5  ?  Dispense:  6 tablet  ?  Refill:  0  ?  ?

## 2021-11-03 ENCOUNTER — Encounter: Payer: Self-pay | Admitting: *Deleted

## 2021-11-10 ENCOUNTER — Ambulatory Visit: Payer: BC Managed Care – PPO | Admitting: Physician Assistant

## 2021-11-10 ENCOUNTER — Encounter: Payer: Self-pay | Admitting: Physician Assistant

## 2021-11-10 VITALS — BP 118/70 | HR 56 | Temp 97.7°F | Ht 64.25 in | Wt 160.2 lb

## 2021-11-10 DIAGNOSIS — M25551 Pain in right hip: Secondary | ICD-10-CM | POA: Diagnosis not present

## 2021-11-10 DIAGNOSIS — Z23 Encounter for immunization: Secondary | ICD-10-CM

## 2021-11-10 MED ORDER — MELOXICAM 15 MG PO TABS: 15.0000 mg | ORAL_TABLET | Freq: Every day | ORAL | 0 refills | Status: DC

## 2021-11-10 NOTE — Patient Instructions (Signed)
It was great to see you!  Start mobic 15 mg daily x 2 weeks Then take as needed after this  Avoid ibuprofen while you are on this  Try the exercises  If no relief, I will get you to sports medicine or PT  Take care,  Inda Coke PA-C

## 2021-11-10 NOTE — Progress Notes (Signed)
Crystal Mitchell is a 46 y.o. female here for a new problem.  History of Present Illness:   Chief Complaint  Patient presents with   Hip Pain    Pt c/o right hip pain, sciatica, radiating down right leg, x 1 year and has gotten worse in the past month, was real bad two weeks ago and now off and on.    Hip Pain     R hip pain Most nights out of the week it wakes her up Affects her when she drives, for even short durations, since this includes her driving foot Feels pain radiate from her buttock to down into her right leg  For the past two weeks it started to happen more constantly.  Affected her mobility at times  Denies: Saddle anesthesia, weakness, new incontinence  Tried: sitting on a tennis ball, aleve, heating pad    Past Medical History:  Diagnosis Date   Allergy    Anxiety    Clotting disorder (HCC)    with pregnancy   GERD (gastroesophageal reflux disease)    HELLP (hemolytic anemia/elev liver enzymes/low platelets in pregnancy) 08/31/2011   Had baby at 31 weeks 5 days   Hyperlipidemia    Seasonal allergies      Social History   Tobacco Use   Smoking status: Former    Packs/day: 0.25    Years: 7.00    Total pack years: 1.75    Types: Cigarettes    Start date: 67    Quit date: 2002    Years since quitting: 21.7   Smokeless tobacco: Never  Vaping Use   Vaping Use: Never used  Substance Use Topics   Alcohol use: Yes    Alcohol/week: 6.0 - 7.0 standard drinks of alcohol    Types: 6 - 7 Glasses of wine per week   Drug use: Not Currently    Types: Marijuana    Comment: In college    Past Surgical History:  Procedure Laterality Date   CESAREAN SECTION  08/29/2011   Procedure: CESAREAN SECTION;  Surgeon: Sherron Monday, MD;  Location: [facility];  Service: Gynecology;  Laterality: N/A;  Primary cesarean section with delivery of baby girl at 33. Apgars 3/8.    Family History  Problem Relation Age of Onset   Osteoarthritis Mother    Asthma Mother     Heart disease Father    Hearing loss Father    Birth defects Brother    Early death Brother    Osteoarthritis Maternal Grandmother    Hyperlipidemia Maternal Grandfather    Heart disease Maternal Grandfather    Heart attack Maternal Grandfather    Heart disease Paternal Grandmother    Heart attack Paternal Grandfather    Hyperlipidemia Paternal Grandfather    Heart disease Paternal Grandfather    Breast cancer Maternal Aunt    Asthma Daughter    Learning disabilities Daughter    Other Neg Hx     Allergies  Allergen Reactions   Pollen Extract-Tree Extract [Pollen Extract] Itching    Itchy eyes, sneezing   Amoxicillin Nausea And Vomiting    Current Medications:   Current Outpatient Medications:    cetirizine (ZYRTEC) 10 MG tablet, 1 tablet, Disp: , Rfl:    fluticasone (FLONASE) 50 MCG/ACT nasal spray, Place 2 sprays into both nostrils daily., Disp: 16 g, Rfl: 6   ibuprofen (ADVIL,MOTRIN) 200 MG tablet, Take 200 mg by mouth every 6 (six) hours as needed., Disp: , Rfl:    valACYclovir (VALTREX)  500 MG tablet, valacyclovir 500 mg tablet  TAKE 1 TABLET BY MOUTH EVERY DAY AND TWICE A DAY WITH OUTBREAKS, Disp: , Rfl:    Review of Systems:   ROS Negative unless otherwise specified per HPI.  Vitals:   Vitals:   11/10/21 1035  BP: 118/70  Pulse: (!) 56  Temp: 97.7 F (36.5 C)  TempSrc: Temporal  SpO2: 99%  Weight: 160 lb 4 oz (72.7 kg)  Height: 5' 4.25" (1.632 m)     Body mass index is 27.29 kg/m.  Physical Exam:   Physical Exam Vitals and nursing note reviewed.  Constitutional:      General: She is not in acute distress.    Appearance: She is well-developed. She is not ill-appearing or toxic-appearing.  Cardiovascular:     Rate and Rhythm: Normal rate and regular rhythm.     Pulses: Normal pulses.     Heart sounds: Normal heart sounds, S1 normal and S2 normal.  Pulmonary:     Effort: Pulmonary effort is normal.     Breath sounds: Normal breath sounds.   Musculoskeletal:     Comments: Tenderness to right SI joint with palpation Normal range of motion of right hip No bony tenderness to lumbar spine  Skin:    General: Skin is warm and dry.  Neurological:     Mental Status: She is alert.     GCS: GCS eye subscore is 4. GCS verbal subscore is 5. GCS motor subscore is 6.  Psychiatric:        Speech: Speech normal.        Behavior: Behavior normal. Behavior is cooperative.     Assessment and Plan:   Right hip pain Suspect piriformis syndrome Provided handout with exercises and more information We will meloxicam 15 mg daily x2 weeks, then as needed thereafter Follow-up if worsening or lack of improvement, will likely refer to PT versus sports medicine  Need for prophylactic vaccination with combined diphtheria-tetanus-pertussis (DTP) vaccine Updated today     Inda Coke, PA-C

## 2022-01-26 DIAGNOSIS — F411 Generalized anxiety disorder: Secondary | ICD-10-CM | POA: Diagnosis not present

## 2022-05-07 ENCOUNTER — Other Ambulatory Visit: Payer: Self-pay | Admitting: Nurse Practitioner

## 2022-05-07 DIAGNOSIS — J301 Allergic rhinitis due to pollen: Secondary | ICD-10-CM

## 2022-06-15 ENCOUNTER — Encounter: Payer: Self-pay | Admitting: Physician Assistant

## 2022-06-15 ENCOUNTER — Telehealth (INDEPENDENT_AMBULATORY_CARE_PROVIDER_SITE_OTHER): Payer: BC Managed Care – PPO | Admitting: Physician Assistant

## 2022-06-15 VITALS — Ht 64.25 in | Wt 160.0 lb

## 2022-06-15 DIAGNOSIS — R0981 Nasal congestion: Secondary | ICD-10-CM | POA: Diagnosis not present

## 2022-06-15 DIAGNOSIS — J301 Allergic rhinitis due to pollen: Secondary | ICD-10-CM

## 2022-06-15 MED ORDER — FLUTICASONE PROPIONATE 50 MCG/ACT NA SUSP: 2.0000 | Freq: Every day | NASAL | 2 refills | Status: DC

## 2022-06-15 MED ORDER — DOXYCYCLINE HYCLATE 100 MG PO TABS: 100.0000 mg | ORAL_TABLET | Freq: Two times a day (BID) | ORAL | 0 refills | Status: DC

## 2022-06-15 NOTE — Progress Notes (Signed)
   I acted as a Neurosurgeon for [employer], PA-C Corky Mull, LPN  Virtual Visit via Video Note   I, Jarold Motto, PA, connected with  Crystal Mitchell  ([id number], Jul 24, 1975) on 06/15/22 at 11:20 AM EDT by a video-enabled telemedicine application and verified that I am speaking with the correct person using two identifiers.  Location: Patient: Home Provider: [facility] [place] office   I discussed the limitations of evaluation and management by telemedicine and the availability of in person appointments. The patient expressed understanding and agreed to proceed.    History of Present Illness: Crystal Mitchell is a 47 y.o. who identifies as a female who was assigned female at birth, and is being seen today for cough. Pt c/o cough and nasal and chest congestion x 1 week, both clear drainage. Having sinus pressure. Denies fever or chills. Pt had slight sore throat and dry cough after coming home from the beach on 4/29. Then 5/3 started other symptoms.  Taking zyrtec, 2 squirts Flonase BID  Problems:  Patient Active Problem List   Diagnosis Date Noted   Gallbladder polyp 04/08/2021   Liver lesion 02/03/2021   Anxiety 01/07/2021   Gastroesophageal reflux disease without esophagitis 01/07/2021   Hypercholesterolemia 01/07/2021    Allergies:  Allergies  Allergen Reactions   Pollen Extract-Tree Extract [Pollen Extract] Itching    Itchy eyes, sneezing   Amoxicillin Nausea And Vomiting   Medications:  Current Outpatient Medications:    cetirizine (ZYRTEC) 10 MG tablet, 1 tablet, Disp: , Rfl:    doxycycline (VIBRA-TABS) 100 MG tablet, Take 1 tablet (100 mg total) by mouth 2 (two) times daily., Disp: 14 tablet, Rfl: 0   fluticasone (FLONASE) 50 MCG/ACT nasal spray, Place 2 sprays into both nostrils daily., Disp: 16 g, Rfl: 6   ibuprofen (ADVIL,MOTRIN) 200 MG tablet, Take 200 mg by mouth every 6 (six) hours as needed., Disp: , Rfl:    valACYclovir (VALTREX) 500 MG  tablet, valacyclovir 500 mg tablet  TAKE 1 TABLET BY MOUTH EVERY DAY AND TWICE A DAY WITH OUTBREAKS, Disp: , Rfl:   Observations/Objective: Patient is well-developed, well-nourished in no acute distress.  Resting comfortably  at home.  Head is normocephalic, atraumatic.  No labored breathing.  Speech is clear and coherent with logical content.  Patient is alert and oriented at baseline.   Assessment and Plan: 1. Sinus congestion No red flags on exam.  Will initiate doxycycline per orders. Discussed taking medications as prescribed. Reviewed return precautions including worsening fever, SOB, worsening cough or other concerns. Push fluids and rest. I recommend that patient follow-up if symptoms worsen or persist despite treatment x 7-10 days, sooner if needed.   Follow Up Instructions: I discussed the assessment and treatment plan with the patient. The patient was provided an opportunity to ask questions and all were answered. The patient agreed with the plan and demonstrated an understanding of the instructions.  A copy of instructions were sent to the patient via MyChart unless otherwise noted below.   The patient was advised to call back or seek an in-person evaluation if the symptoms worsen or if the condition fails to improve as anticipated.  Jarold Motto, [place]

## 2022-08-31 ENCOUNTER — Encounter: Payer: Self-pay | Admitting: Physician Assistant

## 2022-08-31 ENCOUNTER — Ambulatory Visit: Payer: BC Managed Care – PPO | Admitting: Physician Assistant

## 2022-08-31 VITALS — BP 120/80 | HR 61 | Temp 97.7°F | Ht 64.25 in | Wt 161.5 lb

## 2022-08-31 DIAGNOSIS — L03213 Periorbital cellulitis: Secondary | ICD-10-CM | POA: Diagnosis not present

## 2022-08-31 MED ORDER — CEFDINIR 300 MG PO CAPS: 300.0000 mg | ORAL_CAPSULE | Freq: Two times a day (BID) | ORAL | 0 refills | Status: DC

## 2022-08-31 NOTE — Patient Instructions (Signed)
It was great to see you!  Start antibiotic and keep me posted! Read handout for more information about your condition.  Take care,  Jarold Motto PA-C

## 2022-08-31 NOTE — Progress Notes (Signed)
Crystal Mitchell is a 47 y.o. female here for a new problem.  History of Present Illness:   Chief Complaint  Patient presents with   Eye Problem    Pt c/o right eye, red, swollen and painful, drainage. Started yesterday morning.    Eye pain She complains of right eye redness, and pain since yesterday morning while camping.   Her symptoms worsened gradually throughout the day and she developed swelling and drainage yesterday night.  She woke up this morning with her right eye completely swollen shut with drainage.  She denies itching, cough, wheezing, shortness of breathe, sore throat, ear pain, new eye make up, blurry vision.  She is requesting to not take doxycycline due to developing nausea and vomiting while on it.  She is requesting a work note to excuse her absence today and for tomorrow in case her symptoms worsen.    Past Medical History:  Diagnosis Date   Allergy    Anxiety    Clotting disorder (HCC)    with pregnancy   GERD (gastroesophageal reflux disease)    HELLP (hemolytic anemia/elev liver enzymes/low platelets in pregnancy) 08/31/2011   Had baby at 31 weeks 5 days   Hyperlipidemia    Seasonal allergies      Social History   Tobacco Use   Smoking status: Former    Current packs/day: 0.00    Average packs/day: 0.3 packs/day for 7.0 years (1.8 ttl pk-yrs)    Types: Cigarettes    Start date: 76    Quit date: 2002    Years since quitting: 22.5   Smokeless tobacco: Never  Vaping Use   Vaping status: Never Used  Substance Use Topics   Alcohol use: Yes    Alcohol/week: 6.0 - 7.0 standard drinks of alcohol    Types: 6 - 7 Glasses of wine per week   Drug use: Not Currently    Types: Marijuana    Comment: In college    Past Surgical History:  Procedure Laterality Date   CESAREAN SECTION  08/29/2011   Procedure: CESAREAN SECTION;  Surgeon: Sherron Monday, MD;  Location: [facility];  Service: Gynecology;  Laterality: N/A;  Primary cesarean section with  delivery of baby girl at 22. Apgars 3/8.    Family History  Problem Relation Age of Onset   Osteoarthritis Mother    Asthma Mother    Heart disease Father    Hearing loss Father    Birth defects Brother    Early death Brother    Osteoarthritis Maternal Grandmother    Hyperlipidemia Maternal Grandfather    Heart disease Maternal Grandfather    Heart attack Maternal Grandfather    Heart disease Paternal Grandmother    Heart attack Paternal Grandfather    Hyperlipidemia Paternal Grandfather    Heart disease Paternal Grandfather    Breast cancer Maternal Aunt    Asthma Daughter    Learning disabilities Daughter    Other Neg Hx     Allergies  Allergen Reactions   Pollen Extract-Tree Extract [Pollen Extract] Itching    Itchy eyes, sneezing   Amoxicillin Nausea And Vomiting    Current Medications:   Current Outpatient Medications:    cetirizine (ZYRTEC) 10 MG tablet, 1 tablet, Disp: , Rfl:    ibuprofen (ADVIL,MOTRIN) 200 MG tablet, Take 200 mg by mouth every 6 (six) hours as needed., Disp: , Rfl:    valACYclovir (VALTREX) 500 MG tablet, valacyclovir 500 mg tablet  TAKE 1 TABLET BY MOUTH EVERY DAY AND  TWICE A DAY WITH OUTBREAKS, Disp: , Rfl:    fluticasone (FLONASE) 50 MCG/ACT nasal spray, Place 2 sprays into both nostrils daily. (Patient not taking: Reported on 08/31/2022), Disp: 16 g, Rfl: 2   Review of Systems:   Review of Systems  HENT:  Negative for ear pain and sore throat.   Eyes:  Positive for pain (right eye), discharge (right eye) and redness (right eye). Negative for blurred vision.       (+)right eye swelling  Respiratory:  Negative for cough, shortness of breath and wheezing.     Vitals:   Vitals:   08/31/22 1447  BP: 120/80  Pulse: 61  Temp: 97.7 F (36.5 C)  TempSrc: Temporal  SpO2: 97%  Weight: 161 lb 8 oz (73.3 kg)  Height: 5' 4.25" (1.632 m)     Body mass index is 27.51 kg/m.  Physical Exam:   Physical Exam Constitutional:       Appearance: Normal appearance. She is well-developed.  HENT:     Head: Normocephalic and atraumatic.  Eyes:     Extraocular Movements: Extraocular movements intact.     Right eye: Normal extraocular motion and no nystagmus.     Left eye: Normal extraocular motion and no nystagmus.     Conjunctiva/sclera:     Right eye: Right conjunctiva is injected.     Comments: Swelling and erythema to R upper lid Slight swelling and erythema to right lower lid  Pulmonary:     Effort: Pulmonary effort is normal.  Musculoskeletal:        General: Normal range of motion.     Cervical back: Normal range of motion and neck supple.  Skin:    General: Skin is warm and dry.  Neurological:     Mental Status: She is alert and oriented to person, place, and time.  Psychiatric:        Attention and Perception: Attention and perception normal.        Mood and Affect: Mood normal.        Behavior: Behavior normal.        Thought Content: Thought content normal.        Judgment: Judgment normal.     Assessment and Plan:   Preseptal cellulitis of right eye No red flags suggesting systemic infection or compromised vision Recommend oral cefdinir (she cannot tolerate penicillin), NSAIDs, warm compress  If no improvement or any worsening, will need urgent ophtho referral -- AVS with worsening precautions advised   I,Shehryar Baig,acting as a scribe for [employer], PA.,have documented all relevant documentation on the behalf of Jarold Motto, PA,as directed by  Jarold Motto, PA while in the presence of Jarold Motto, [place].  I, Jarold Motto, [place], have reviewed all documentation for this visit. The documentation on 08/31/22 for the exam, diagnosis, procedures, and orders are all accurate and complete.  Jarold Motto, PA-C

## 2022-12-17 ENCOUNTER — Telehealth: Payer: BC Managed Care – PPO | Admitting: Physician Assistant

## 2022-12-17 DIAGNOSIS — J019 Acute sinusitis, unspecified: Secondary | ICD-10-CM | POA: Diagnosis not present

## 2022-12-17 DIAGNOSIS — B9689 Other specified bacterial agents as the cause of diseases classified elsewhere: Secondary | ICD-10-CM | POA: Diagnosis not present

## 2022-12-17 MED ORDER — DOXYCYCLINE HYCLATE 100 MG PO TABS: 100.0000 mg | ORAL_TABLET | Freq: Two times a day (BID) | ORAL | 0 refills | Status: AC

## 2022-12-17 NOTE — Progress Notes (Signed)
I have spent 5 minutes in review of e-visit questionnaire, review and updating patient chart, medical decision making and response to patient.   Mia Milan Cody Jacklynn Dehaas, PA-C    

## 2022-12-17 NOTE — Progress Notes (Signed)

## 2023-03-12 IMAGING — CT CT CARDIAC CORONARY ARTERY CALCIUM SCORE
3 series · 14 of 20 positions shown, 16 images · non-contrast
Comparison: None.
COMPARISON: None.

Addendum:
EXAM:
OVER-READ INTERPRETATION  CT CHEST

The following report is an over-read performed by radiologist Dr.
Amnon Tiger [REDACTED] on 01/28/2021. This
over-read does not include interpretation of cardiac or coronary
anatomy or pathology. The coronary calcium score interpretation by
the cardiologist is attached.
CLINICAL DATA: Risk stratification: 45 Year-old White Female
Coronary Calcium Score
TECHNIQUE: The patient was scanned on a Siemens Force scanner. Axial
non-contrast 3 mm slices were carried out through the heart. The
data set was analyzed on a dedicated work station and scored using
the Agatson method.

[Series 2: cascseq 2.0 sa36 (id) (id) · axial · 0.35mm/px · z∈[+1396,+1476]mm · 4 of 68 slices shown]
[im 14/68  vessel]
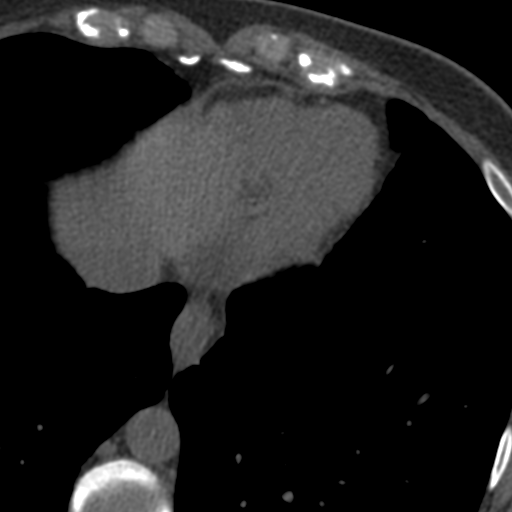
[im 27/68  vessel]
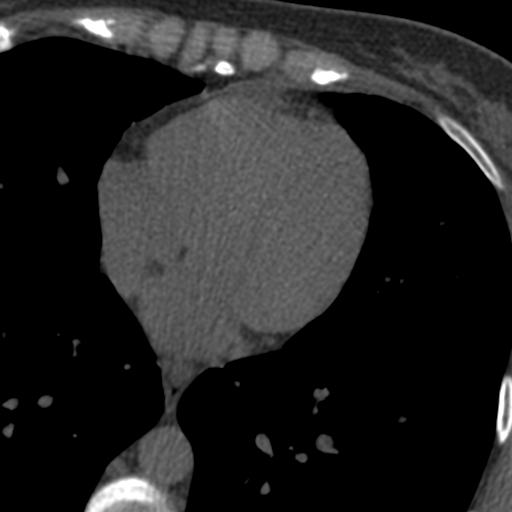
[im 41/68  vessel]
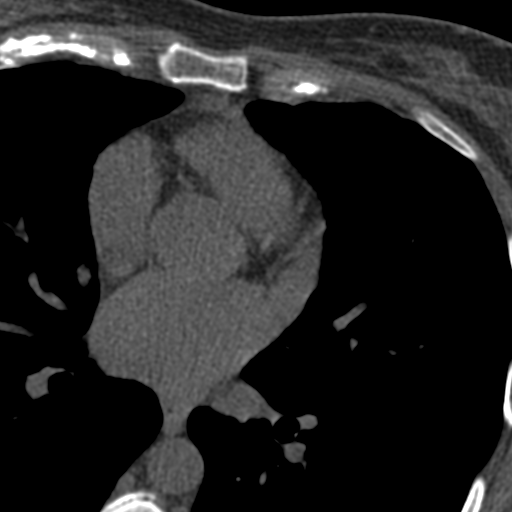
[im 54/68  vessel]
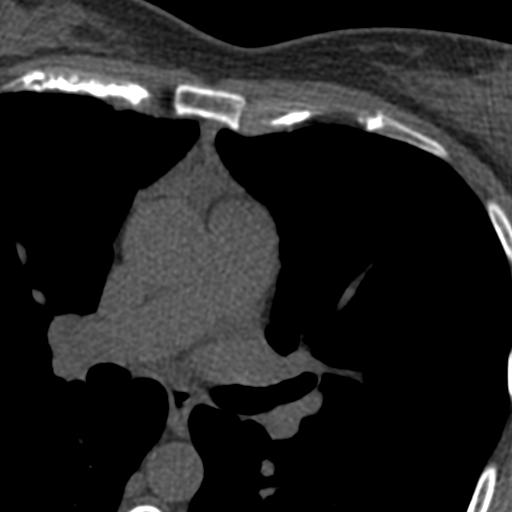

[Series 3: cascseq 2.0 bf37 st · axial · 0.57mm/px · z∈[+1392,+1480]mm · 5 of 68 slices shown, 7 images]
[im 12/68  vessel]
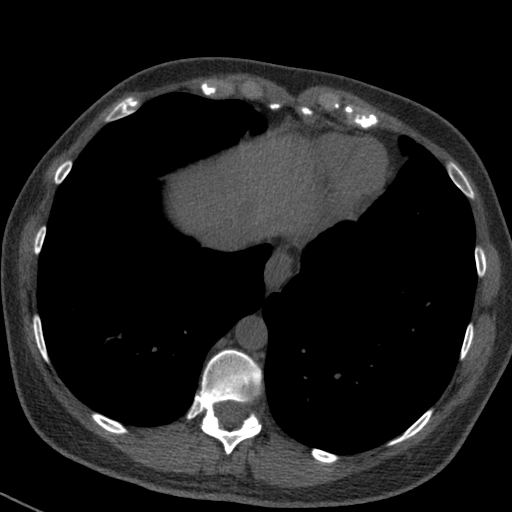
[im 12/68  lung]
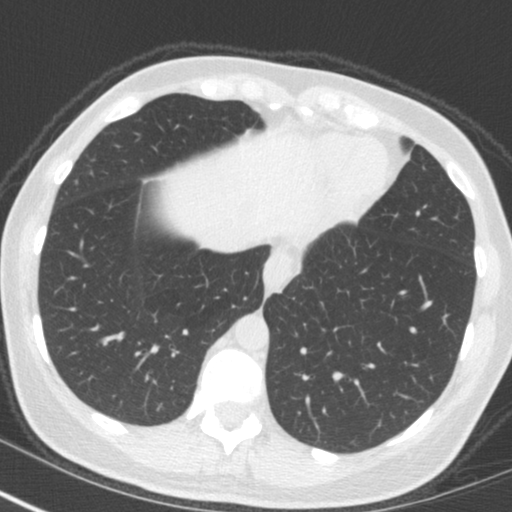
[im 23/68  vessel]
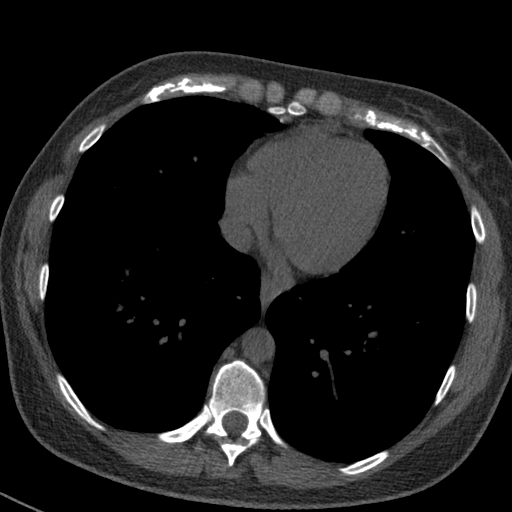
[im 34/68  vessel]
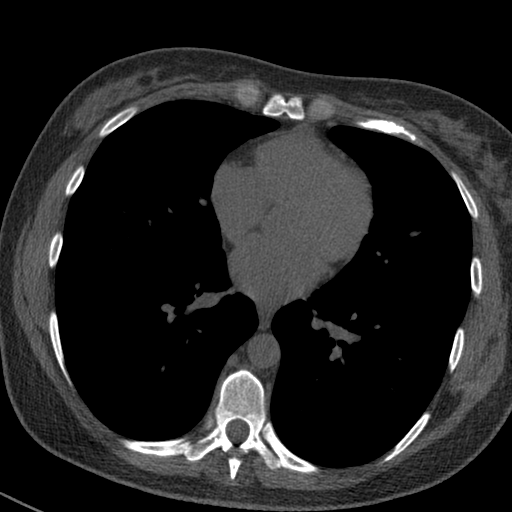
[im 45/68  vessel]
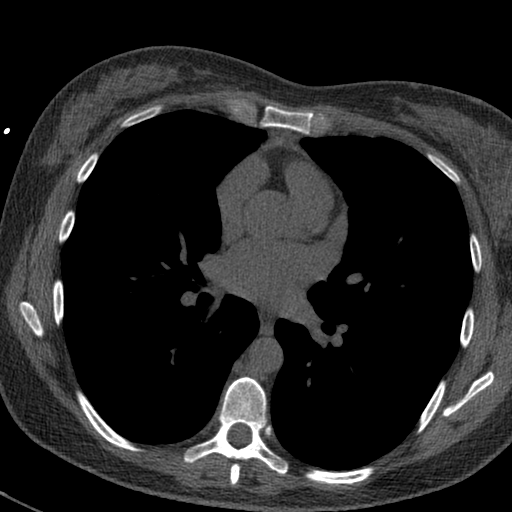
[im 56/68  vessel]
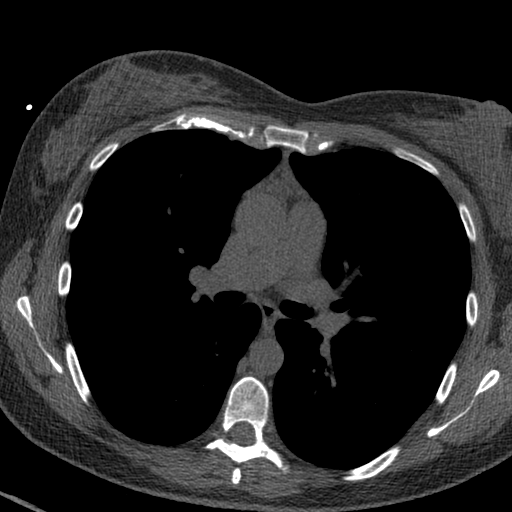
[im 56/68  lung]
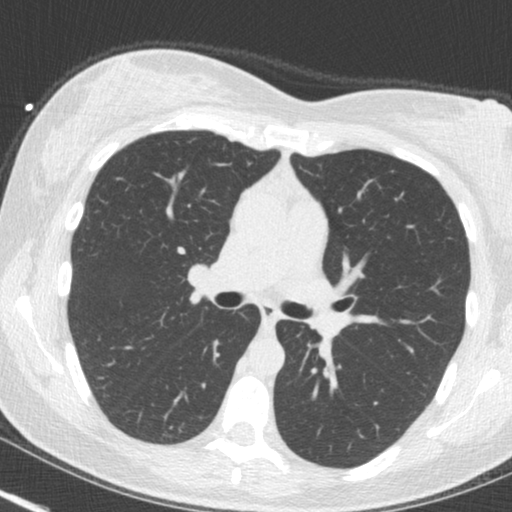

[Series 4: cascseq 2.0 br59 lung · axial · 0.57mm/px · z∈[+1392,+1480]mm · 5 of 68 slices shown]
[im 12/68  lung]
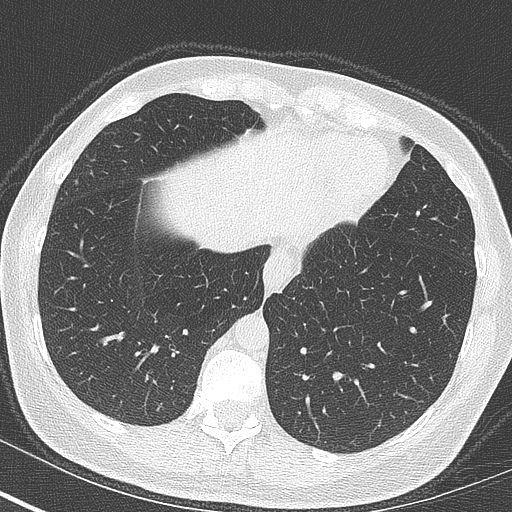
[im 23/68  lung]
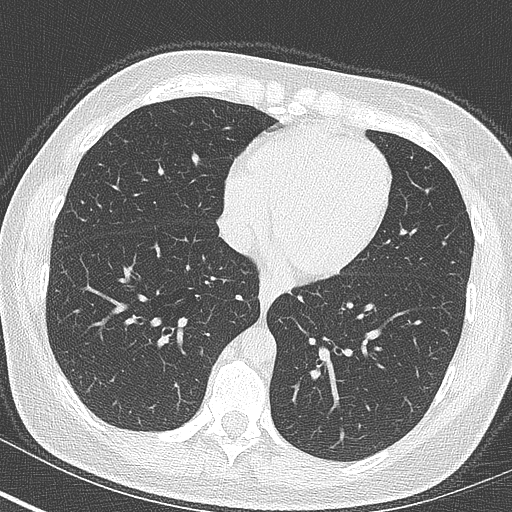
[im 34/68  lung]
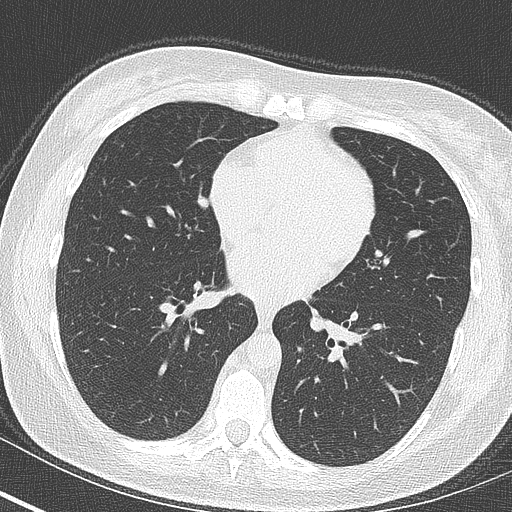
[im 45/68  lung]
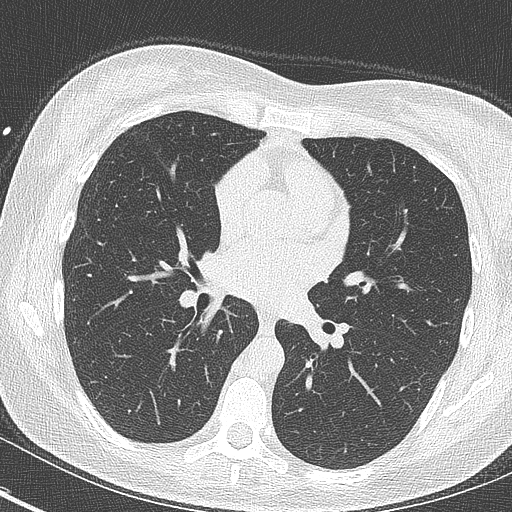
[im 56/68  lung]
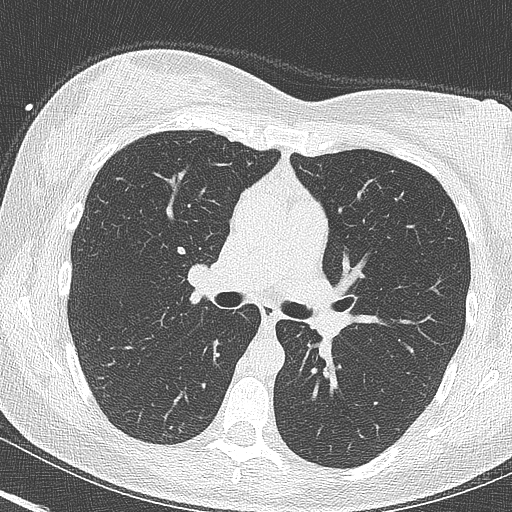

[14 of 20 positions shown; findings below may reference images not displayed]

FINDINGS: Vascular: Heart is normal size.  Aorta normal caliber.

Mediastinum/Nodes: No adenopathy.  Small hiatal hernia.

Lungs/Pleura: No confluent opacity in the lungs.  No effusions.

Upper Abdomen: 13 mm low-density lesion in the left hepatic lobe,
most likely cyst.

Musculoskeletal: Chest wall soft tissues are unremarkable. No acute
bony abnormality.
IMPRESSION: No acute extra cardiac abnormality.

13 mm low-density lesion in the left hepatic lobe, likely cyst. This
could be confirmed with right upper quadrant ultrasound.
FINDINGS: Non-cardiac: See separate report from [REDACTED].

Ascending Aorta: Normal caliber.

Pericardium: Normal.

Coronary arteries: Normal origins.

Coronary Calcium Score:

Left main: 0

Left anterior descending artery: 0

Left circumflex artery: 0

Right coronary artery: 0

Total: 0

Percentile: 1st for age, sex, and race matched control.
IMPRESSION: 1. Coronary calcium score of 0. This was 1st percentile for age,
gender, and race matched controls.

RECOMMENDATIONS:



If CAC = 0, it is reasonable to withhold statin therapy and reassess
in 5 to 10 years, as long as higher risk conditions are absent
(diabetes mellitus, family history of premature CHD in first degree
relatives (males <55 years; females <65 years), cigarette smoking,
LDL >=190 mg/dL or other independent risk factors).

If CAC is 1 to 99, it is reasonable to initiate statin therapy for
patients >=55 years of age.

If CAC is >=100 or >=75th percentile, it is reasonable to initiate
statin therapy at any age.

Cardiology referral should be considered for patients with CAC
scores =400 or >=75th percentile.

*3417 AHA/ACC/AACVPR/AAPA/ABC/TAMARIN/TC/KOBAYASHI/Jd/ARASTUMZADA/TIGER/ROSILLO
Guideline on the Management of Blood Cholesterol: A Report of the
American College of Cardiology/American Heart Association Task Force
on Clinical Practice Guidelines. J Am Coll Cardiol.
0783;73(24):2390-2906.

*** End of Addendum ***
EXAM:
OVER-READ INTERPRETATION  CT CHEST

The following report is an over-read performed by radiologist Dr.
Amnon Tiger [REDACTED] on 01/28/2021. This
over-read does not include interpretation of cardiac or coronary
anatomy or pathology. The coronary calcium score interpretation by
the cardiologist is attached.
FINDINGS: Vascular: Heart is normal size.  Aorta normal caliber.

Mediastinum/Nodes: No adenopathy.  Small hiatal hernia.

Lungs/Pleura: No confluent opacity in the lungs.  No effusions.

Upper Abdomen: 13 mm low-density lesion in the left hepatic lobe,
most likely cyst.

Musculoskeletal: Chest wall soft tissues are unremarkable. No acute
bony abnormality.
IMPRESSION: No acute extra cardiac abnormality.

13 mm low-density lesion in the left hepatic lobe, likely cyst. This
could be confirmed with right upper quadrant ultrasound.

## 2023-06-17 ENCOUNTER — Other Ambulatory Visit: Payer: Self-pay

## 2023-06-17 ENCOUNTER — Encounter: Payer: Self-pay | Admitting: Physician Assistant

## 2023-06-17 DIAGNOSIS — J301 Allergic rhinitis due to pollen: Secondary | ICD-10-CM

## 2023-06-17 MED ORDER — FLUTICASONE PROPIONATE 50 MCG/ACT NA SUSP: 2.0000 | Freq: Every day | NASAL | 2 refills | Status: DC

## 2023-11-17 ENCOUNTER — Other Ambulatory Visit: Payer: Self-pay | Admitting: Physician Assistant

## 2023-11-17 DIAGNOSIS — J301 Allergic rhinitis due to pollen: Secondary | ICD-10-CM
